# Patient Record
Sex: Male | Born: 1975 | Race: White | Hispanic: No | Marital: Single | Smoking: Current every day smoker
Health system: Southern US, Community
[De-identification: ages and names within clinical notes are randomized; demographics above are authoritative.]

## PROBLEM LIST (undated history)

## (undated) DIAGNOSIS — F1990 Other psychoactive substance use, unspecified, uncomplicated: Secondary | ICD-10-CM

## (undated) HISTORY — PX: EYE SURGERY: SHX253

## (undated) HISTORY — PX: OTHER SURGICAL HISTORY: SHX169

---

## 2014-05-10 ENCOUNTER — Emergency Department: Payer: Self-pay | Admitting: Emergency Medicine

## 2016-01-25 ENCOUNTER — Encounter: Payer: Self-pay | Admitting: Medical Oncology

## 2016-01-25 ENCOUNTER — Emergency Department: Payer: Self-pay

## 2016-01-25 ENCOUNTER — Emergency Department
Admission: EM | Admit: 2016-01-25 | Discharge: 2016-01-25 | Disposition: A | Discharge status: Home / Self Care | Payer: Self-pay | Attending: Emergency Medicine | Admitting: Emergency Medicine

## 2016-01-25 DIAGNOSIS — W11XXXA Fall on and from ladder, initial encounter: Secondary | ICD-10-CM | POA: Insufficient documentation

## 2016-01-25 DIAGNOSIS — S46911A Strain of unspecified muscle, fascia and tendon at shoulder and upper arm level, right arm, initial encounter: Secondary | ICD-10-CM | POA: Insufficient documentation

## 2016-01-25 DIAGNOSIS — Y929 Unspecified place or not applicable: Secondary | ICD-10-CM | POA: Insufficient documentation

## 2016-01-25 DIAGNOSIS — Y999 Unspecified external cause status: Secondary | ICD-10-CM | POA: Insufficient documentation

## 2016-01-25 DIAGNOSIS — W19XXXA Unspecified fall, initial encounter: Secondary | ICD-10-CM

## 2016-01-25 DIAGNOSIS — Y939 Activity, unspecified: Secondary | ICD-10-CM | POA: Insufficient documentation

## 2016-01-25 MED ORDER — DOCUSATE SODIUM 100 MG PO CAPS: ORAL_CAPSULE | ORAL | Status: DC

## 2016-01-25 MED ORDER — HYDROCODONE-ACETAMINOPHEN 5-325 MG PO TABS: 1.0000 | ORAL_TABLET | ORAL | Status: DC | PRN

## 2016-01-25 NOTE — ED Provider Notes (Signed)
[facility] Emergency Department Provider Note  ____________________________________________  Time seen: Approximately 3:53 PM  I have reviewed the triage vital signs and the nursing notes.   HISTORY  Chief Complaint Fall and Shoulder Pain    HPI Robert Cobb is a 40 y.o. male without significant PMH who presents with pain in R shoulder.  Acute onset when he fell off a ladder last night, falling about 10 ft and landing on the grass but catching weight on RUE.  Did not strike head, no LOC, no neck pain.  Persistent moderate pain in RUE, much worse with movement of R shoulder.  No pain in elbow nor wrist.  No difficulty or limitation of ROM of RUE except due to pain in shoulder.  No difficulty breathing.  Denies CP, SOB, abd pain, N/V/D   History reviewed. No pertinent past medical history.  There are no active problems to display for this patient.   Past Surgical History  Procedure Laterality Date  . Orbital socket surgery      Current Outpatient Rx  Name  Route  Sig  Dispense  Refill  . docusate sodium (COLACE) 100 MG capsule      Take 1 tablet once or twice daily as needed for constipation while taking narcotic pain medicine   30 capsule   0   . HYDROcodone-acetaminophen (NORCO/VICODIN) 5-325 MG tablet   Oral   Take 1-2 tablets by mouth every 4 (four) hours as needed for moderate pain.   30 tablet   0     Allergies Review of patient's allergies indicates no known allergies.  No family history on file.  Social History Social History  Substance Use Topics  . Smoking status: None  . Smokeless tobacco: None  . Alcohol Use: None    Review of Systems Constitutional: No fever/chills Eyes: No visual changes. ENT: No sore throat. Cardiovascular: Denies chest pain. Respiratory: Denies shortness of breath. Gastrointestinal: No abdominal pain.  No nausea, no vomiting.  No diarrhea.  No constipation. Genitourinary: Negative for  dysuria. Musculoskeletal: moderate to severe pain in R shoulder.  Negative for back pain.  Denies neck pain. Skin: Negative for rash. Neurological: Negative for headaches, focal weakness or numbness.  10-point ROS otherwise negative.  ____________________________________________   PHYSICAL EXAM:  VITAL SIGNS: ED Triage Vitals  Enc Vitals Group     BP 01/25/16 1432 146/93 mmHg     Pulse Rate 01/25/16 1432 61     Resp 01/25/16 1432 18     Temp 01/25/16 1432 97.8 F (36.6 C)     Temp Source 01/25/16 1432 Oral     SpO2 01/25/16 1432 100 %     Weight 01/25/16 1432 220 lb (99.791 kg)     Height 01/25/16 1432  (1.778 m)     Head Cir --      Peak Flow --      Pain Score 01/25/16 1433 8     Pain Loc --      Pain Edu? --      Excl. in GC? --     Constitutional: Alert and oriented. Well appearing and in no acute distress. Eyes: Conjunctivae are normal. PERRL. EOMI. Head: Atraumatic. Neck: No stridor.  No meningeal signs.  No cervical spine tenderness to palpation. Respiratory: Normal respiratory effort.  No retractions. Lungs CTAB. Gastrointestinal: Soft and nontender. No distention.  Musculoskeletal: No palpable or visible deformities to right upper extremity including the right shoulder and clavicle.  Severely tender to  palpation all throughout the right shoulder but not distal to the proximal humerus.  Normal range of motion of elbow and wrist.  No acute injuries distally in the right upper extremity.  Other extremities are normal in appearance. Neurologic:  Normal speech and language. No gross focal neurologic deficits are appreciated.  Skin:  Skin is warm, dry and intact. No rash noted.  Subacute abrasion to anterior left lower leg Psychiatric: Mood and affect are normal. Speech and behavior are normal.  ____________________________________________   LABS (all labs ordered are listed, but only abnormal results are displayed)  Labs Reviewed - No data to  display ____________________________________________  EKG  None ____________________________________________  RADIOLOGY   Dg Chest 2 View  01/25/2016  CLINICAL DATA:  40 year old male who fell from 10 foot ladder last night. Right Shoulder pain, chest pain. Initial encounter. EXAM: CHEST  2 VIEW COMPARISON:  Right shoulder series from today reported separately. FINDINGS: Normal lung volumes. Normal cardiac size and mediastinal contours. Visualized tracheal air column is within normal limits. The patient right arm could not be raised for the lateral view. Both lungs appear clear. No pneumothorax or pleural effusion. No acute osseous abnormality identified. IMPRESSION: No acute cardiopulmonary abnormality or acute traumatic injury identified. Electronically Signed   By: Odessa FlemingH  Hall M.D.   On: 01/25/2016 15:27   Dg Shoulder Right  01/25/2016  CLINICAL DATA:  40 year old male who fell from 10 foot ladder last night. Right Shoulder pain, chest pain. Initial encounter. EXAM: RIGHT SHOULDER - 2+ VIEW COMPARISON:  None. FINDINGS: No glenohumeral joint dislocation. Bone mineralization is within normal limits. Proximal right humerus intact. Visible clavicle and right scapula appear intact. Visualized right ribs and lung parenchyma within normal limits. IMPRESSION: No acute fracture or dislocation identified about the right shoulder. Electronically Signed   By: Odessa FlemingH  Hall M.D.   On: 01/25/2016 15:26    ____________________________________________   PROCEDURES  Procedure(s) performed: None  Critical Care performed: No ____________________________________________   INITIAL IMPRESSION / ASSESSMENT AND PLAN / ED COURSE  Pertinent labs & imaging results that were available during my care of the patient were reviewed by me and considered in my medical decision making (see chart for details).  No acute fracture or dislocation.  No neurovascular compromise.  I advised the patient to rest the affected  extremity, use over-the-counter pain medication, prescription pain medicines when necessary, use of sling, and orthopedics follow-up in about one week.  I gave my usual and customary return precautions.     ____________________________________________  FINAL CLINICAL IMPRESSION(S) / ED DIAGNOSES  Final diagnoses:  Right shoulder strain, initial encounter  Fall, initial encounter      NEW MEDICATIONS STARTED DURING THIS VISIT:  New Prescriptions   DOCUSATE SODIUM (COLACE) 100 MG CAPSULE    Take 1 tablet once or twice daily as needed for constipation while taking narcotic pain medicine   HYDROCODONE-ACETAMINOPHEN (NORCO/VICODIN) 5-325 MG TABLET    Take 1-2 tablets by mouth every 4 (four) hours as needed for moderate pain.      Note:  This document was prepared using Dragon voice recognition software and may include unintentional dictation errors.   Loleta Roseory Berk Pilot, MD 01/25/16 [phone]

## 2016-01-25 NOTE — Discharge Instructions (Signed)
We believe that your symptoms are caused by musculoskeletal strain.  Please read through the included information about additional care such as ice packs, heating pads, over-the-counter pain medicine.  Use the sling as for comfort.  Remember that early mobility and using the affected part of your body is actually better than keeping it immobile, and follow up with orthopedics in about a week is important; they will help determine if you had a more serious injury that needs additional outpatient evaluation.  Take Norco as prescribed for severe pain. Do not drink alcohol, drive or participate in any other potentially dangerous activities while taking this medication as it may make you sleepy. Do not take this medication with any other sedating medications, either prescription or over-the-counter. If you were prescribed Percocet or Vicodin, do not take these with acetaminophen (Tylenol) as it is already contained within these medications.   This medication is an opiate (or narcotic) pain medication and can be habit forming.  Use it as little as possible to achieve adequate pain control.  Do not use or use it with extreme caution if you have a history of opiate abuse or dependence.  If you are on a pain contract with your primary care doctor or a pain specialist, be sure to let them know you were prescribed this medication today from the [facility].  This medication is intended for your use only - do not give any to anyone else and keep it in a secure place where nobody else, especially children, have access to it.  It will also cause or worsen constipation, so you may want to consider taking an over-the-counter stool softener while you are taking this medication.  Follow-up with the doctor listed as recommended or return to the emergency department with new or worsening symptoms that concern you.

## 2016-01-25 NOTE — ED Notes (Signed)
Pt reports he fell about 4210ft from a ladder last night around 0800. Pt reports pain to rt shoulder. Denies sob. Pt ambulatory in NAD. Denies neck pain/denies LOC.

## 2016-09-17 ENCOUNTER — Emergency Department
Admission: EM | Admit: 2016-09-17 | Discharge: 2016-09-17 | Disposition: A | Discharge status: Home / Self Care | Payer: Self-pay | Attending: Emergency Medicine | Admitting: Emergency Medicine

## 2016-09-17 ENCOUNTER — Encounter: Payer: Self-pay | Admitting: Emergency Medicine

## 2016-09-17 DIAGNOSIS — S46912A Strain of unspecified muscle, fascia and tendon at shoulder and upper arm level, left arm, initial encounter: Secondary | ICD-10-CM | POA: Insufficient documentation

## 2016-09-17 DIAGNOSIS — W208XXA Other cause of strike by thrown, projected or falling object, initial encounter: Secondary | ICD-10-CM | POA: Insufficient documentation

## 2016-09-17 DIAGNOSIS — Y999 Unspecified external cause status: Secondary | ICD-10-CM | POA: Insufficient documentation

## 2016-09-17 DIAGNOSIS — Y9389 Activity, other specified: Secondary | ICD-10-CM | POA: Insufficient documentation

## 2016-09-17 DIAGNOSIS — Y929 Unspecified place or not applicable: Secondary | ICD-10-CM | POA: Insufficient documentation

## 2016-09-17 DIAGNOSIS — Z79899 Other long term (current) drug therapy: Secondary | ICD-10-CM | POA: Insufficient documentation

## 2016-09-17 DIAGNOSIS — T148XXA Other injury of unspecified body region, initial encounter: Secondary | ICD-10-CM

## 2016-09-17 MED ORDER — ORPHENADRINE CITRATE 30 MG/ML IJ SOLN
60.0000 mg | Freq: Two times a day (BID) | INTRAMUSCULAR | Status: DC
  Administered 2016-09-17: 60 mg via INTRAMUSCULAR
  Filled 2016-09-17: qty 2

## 2016-09-17 MED ORDER — CYCLOBENZAPRINE HCL 5 MG PO TABS: 5.0000 mg | ORAL_TABLET | Freq: Every day | ORAL | 0 refills | Status: AC

## 2016-09-17 MED ORDER — MELOXICAM 15 MG PO TABS: 15.0000 mg | ORAL_TABLET | Freq: Every day | ORAL | 0 refills | Status: AC

## 2016-09-17 MED ORDER — KETOROLAC TROMETHAMINE 60 MG/2ML IM SOLN
60.0000 mg | Freq: Once | INTRAMUSCULAR | Status: AC
  Administered 2016-09-17: 60 mg via INTRAMUSCULAR
  Filled 2016-09-17: qty 2

## 2016-09-17 NOTE — ED Notes (Signed)
Pt reports that 5-6 months ago he came to ED because he hurt left shoulder - a few days ago was pushing a furnace into the attic and the furnace started to fall and the pt grabbed it pulling him off the stairs and pulling left arm down - left side of neck down into left shoulder are throbbing and numb - pt reports hearing a "pop" when he grabbed the furnace

## 2016-09-17 NOTE — ED Triage Notes (Signed)
Patient presents to the ED with left sided neck and shoulder pain with numbness after he attempted to catch a falling furnace.  Patient has sensation and mobility to left fingers.  Patient is holding arm very still in triage.  No obvious distress at this time.

## 2016-09-17 NOTE — ED Provider Notes (Signed)
[facility] Emergency Department Provider Note  ____________________________________________  Time seen: Approximately 12:21 PM  I have reviewed the triage vital signs and the nursing notes.   HISTORY  Chief Complaint Shoulder Pain and Neck Pain    HPI Robert Cobb is a 40 y.o. male that presents to the emergency department with left shoulder pain and upper left back pain after trying to catch a falling furnace yesterday. Patient states that he was able to fully move left arm that it is painful over upper left back with movement. Patient denies direct trauma to shoulder or muscle. He denies any additional injuries.   History reviewed. No pertinent past medical history.  There are no active problems to display for this patient.   Past Surgical History:  Procedure Laterality Date  . Orbital socket surgery      Prior to Admission medications   Medication Sig Start Date End Date Taking? Authorizing Provider  cyclobenzaprine (FLEXERIL) 5 MG tablet Take 1 tablet (5 mg total) by mouth at bedtime. 09/17/16 09/27/16  Enid DerryAshley Marcheta Horsey, PA-C  docusate sodium (COLACE) 100 MG capsule Take 1 tablet once or twice daily as needed for constipation while taking narcotic pain medicine 01/25/16   Loleta Roseory Forbach, MD  HYDROcodone-acetaminophen (NORCO/VICODIN) 5-325 MG tablet Take 1-2 tablets by mouth every 4 (four) hours as needed for moderate pain. 01/25/16   Loleta Roseory Forbach, MD  meloxicam (MOBIC) 15 MG tablet Take 1 tablet (15 mg total) by mouth daily. 09/17/16 09/27/16  Enid DerryAshley Edda Orea, PA-C    Allergies Patient has no known allergies.  No family history on file.  Social History Social History  Substance Use Topics  . Smoking status: Never Smoker  . Smokeless tobacco: Never Used  . Alcohol use Yes     Comment: rarely     Review of Systems  Constitutional: No fever/chills Eyes: No visual changes. No discharge ENT: No upper respiratory complaints. Cardiovascular: no  chest pain. Respiratory: no cough. No SOB. Gastrointestinal: No abdominal pain.  No nausea, no vomiting.  No diarrhea.  No constipation. Musculoskeletal: Negative for musculoskeletal pain. Skin: Negative for rash, abrasions, lacerations, ecchymosis. Neurological: Negative for headaches, focal weakness or numbness.   ____________________________________________   PHYSICAL EXAM:  VITAL SIGNS: ED Triage Vitals  Enc Vitals Group     BP 09/17/16 1128 126/76     Pulse Rate 09/17/16 1128 73     Resp 09/17/16 1128 18     Temp 09/17/16 1128 98.5 F (36.9 C)     Temp Source 09/17/16 1128 Oral     SpO2 09/17/16 1128 97 %     Weight 09/17/16 1128 237 lb (107.5 kg)     Height 09/17/16 1128 5\' 11"  (1.803 m)     Head Circumference --      Peak Flow --      Pain Score 09/17/16 1130 10     Pain Loc --      Pain Edu? --      Excl. in GC? --      Constitutional: Alert and oriented. Well appearing and in no acute distress. Eyes: Conjunctivae are normal. PERRL. EOMI. Head: Atraumatic. ENT:      Ears:       Nose: No congestion/rhinnorhea.      Mouth/Throat: Mucous membranes are moist.  Neck: No stridor.  No cervical spine tenderness to palpation.Full range of motion of neck. Hematological/Lymphatic/Immunilogical: No cervical lymphadenopathy. Cardiovascular: Normal rate, regular rhythm. Normal S1 and S2.  Good peripheral circulation. Respiratory: Normal  respiratory effort without tachypnea or retractions. Lungs CTAB. Good air entry to the bases with no decreased or absent breath sounds. Musculoskeletal: Full range of motion to all extremities. No gross deformities appreciated. Pain over trapezius muscle with abduction of left arm. Tenderness to palpation throughout trapezius muscle. No tenderness to palpation over shoulder joint. Neurologic:  Normal speech and language. No gross focal neurologic deficits are appreciated.  Skin:  Skin is warm, dry and intact. No rash noted. Psychiatric: Mood  and affect are normal. Speech and behavior are normal. Patient exhibits appropriate insight and judgement.   ____________________________________________   LABS (all labs ordered are listed, but only abnormal results are displayed)  Labs Reviewed - No data to display ____________________________________________  EKG   ____________________________________________  RADIOLOGY   No results found.  ____________________________________________    PROCEDURES  Procedure(s) performed:    Procedures    Medications  orphenadrine (NORFLEX) injection 60 mg (60 mg Intramuscular Given 09/17/16 1236)  ketorolac (TORADOL) injection 60 mg (60 mg Intramuscular Given 09/17/16 1236)     ____________________________________________   INITIAL IMPRESSION / ASSESSMENT AND PLAN / ED COURSE  Pertinent labs & imaging results that were available during my care of the patient were reviewed by me and considered in my medical decision making (see chart for details).  Review of the [place] CSRS was performed in accordance of the [facility] prior to dispensing any controlled drugs.  Clinical Course     Patient's diagnosis is consistent with Muscle strain. Patient has tenderness to palpation throughout trapezius muscle. Patient has full range of motion of shoulder and neck. No indication for x-ray as there was no direct injury to shoulder and no pain over shoulder. Patient states that this pain feels similar to previous muscle pain, which resolved after a couple of weeks. Patient will be discharged home with prescriptions for Meloxicam and Flexeril. Patient is to eat take Flexeril at night and not while working. Patient is to follow up with PCP as needed or otherwise directed. Patient is given ED precautions to return to the ED for any worsening or new symptoms.     ____________________________________________  FINAL CLINICAL IMPRESSION(S) / ED DIAGNOSES  Final diagnoses:  Muscle strain       NEW MEDICATIONS STARTED DURING THIS VISIT:  Discharge Medication List as of 09/17/2016 12:56 PM    START taking these medications   Details  cyclobenzaprine (FLEXERIL) 5 MG tablet Take 1 tablet (5 mg total) by mouth at bedtime., Starting Mon 09/17/2016, Until Thu 09/27/2016, Print    meloxicam (MOBIC) 15 MG tablet Take 1 tablet (15 mg total) by mouth daily., Starting Mon 09/17/2016, Until Thu 09/27/2016, Print            This chart was dictated using voice recognition software/Dragon. Despite best efforts to proofread, errors can occur which can change the meaning. Any change was purely unintentional.   Enid DerryAshley Vernor Monnig, PA-C 09/17/16 1412    Sharman CheekPhillip Stafford, MD 09/18/16 [phone]

## 2016-11-10 ENCOUNTER — Emergency Department
Admission: EM | Admit: 2016-11-10 | Discharge: 2016-11-10 | Disposition: A | Discharge status: Home / Self Care | Payer: 59 | Attending: Emergency Medicine | Admitting: Emergency Medicine

## 2016-11-10 DIAGNOSIS — F1110 Opioid abuse, uncomplicated: Secondary | ICD-10-CM | POA: Insufficient documentation

## 2016-11-10 LAB — CBC
HCT: 44.7 % (ref 40.0–52.0)
HEMOGLOBIN: 14.9 g/dL (ref 13.0–18.0)
MCH: 27.3 pg (ref 26.0–34.0)
MCHC: 33.3 g/dL (ref 32.0–36.0)
MCV: 82 fL (ref 80.0–100.0)
Platelets: 249 10*3/uL (ref 150–440)
RBC: 5.45 MIL/uL (ref 4.40–5.90)
RDW: 14.5 % (ref 11.5–14.5)
WBC: 8.7 10*3/uL (ref 3.8–10.6)

## 2016-11-10 LAB — COMPREHENSIVE METABOLIC PANEL
ALBUMIN: 4.4 g/dL (ref 3.5–5.0)
ALK PHOS: 58 U/L (ref 38–126)
ALT: 68 U/L — AB (ref 17–63)
AST: 48 U/L — AB (ref 15–41)
Anion gap: 8 (ref 5–15)
BILIRUBIN TOTAL: 0.8 mg/dL (ref 0.3–1.2)
BUN: 13 mg/dL (ref 6–20)
CALCIUM: 9.1 mg/dL (ref 8.9–10.3)
CO2: 25 mmol/L (ref 22–32)
CREATININE: 1.03 mg/dL (ref 0.61–1.24)
Chloride: 106 mmol/L (ref 101–111)
GFR calc Af Amer: >60 mL/min (ref >60)
GLUCOSE: 114 mg/dL — AB (ref 65–99)
Potassium: 3.8 mmol/L (ref 3.5–5.1)
Sodium: 139 mmol/L (ref 135–145)
TOTAL PROTEIN: 8.5 g/dL — AB (ref 6.5–8.1)

## 2016-11-10 LAB — ETHANOL

## 2016-11-10 MED ORDER — ONDANSETRON 4 MG PO TBDP
4.0000 mg | ORAL_TABLET | Freq: Once | ORAL | Status: AC
  Administered 2016-11-10: 4 mg via ORAL
  Filled 2016-11-10: qty 1

## 2016-11-10 NOTE — Discharge Instructions (Signed)
Please return immediately if condition worsens. Please contact her primary physician or the physician you were given for referral. If you have any specialist physicians involved in her treatment and plan please also contact them. Thank you for using [facility]. ° °

## 2016-11-10 NOTE — ED Notes (Signed)
Pt states he is needing a referral to get help with detoxing off of opiates. Pt typically uses hydrocodone and last used 10mg  today as well as 50mg  of Tramadol. Pt states he takes Suboxone daily as well which he took before the opiates and reports intermittent episodes of vomiting and diarrhea.  Pt states he last used IV heroin about 2 weeks ago. Denies any SI/HI.  Pt states he needs help but cannot do anything until he talks to his boss on Monday and wants to start treatment on Tuesday after talking to his boss so he does not lose his job.

## 2016-11-10 NOTE — ED Provider Notes (Signed)
Time Seen: Approximately 1230  I have reviewed the triage notes  Chief Complaint: Addiction Problem   History of Present Illness: Robert Cobb is a 41 y.o. male *who arrives requesting detox. The patient states he took some Ultram and Vicodin earlier today. He has a history of heroin abuse in the last time he used IV heroin was one week ago. He states he's been using illicit drugs now for the past 2 years. He is here voluntarily and wishes to get outpatient drug treatment established. He's had some nausea and vomiting he felt mostly due to him taking Suboxone and then taking the narcotic medication. The patient denies any chest pain or abdominal pain or diarrhea. He denies any suicidal thoughts, homicidal thoughts, hallucinations. History reviewed. No pertinent past medical history.  There are no active problems to display for this patient.   Past Surgical History:  Procedure Laterality Date  . Orbital socket surgery      Past Surgical History:  Procedure Laterality Date  . Orbital socket surgery      Current Outpatient Rx  . Order #: [id number] Class: Print  . Order #: [id number] Class: Print    Allergies:  Patient has no known allergies.  Family History: No family history on file.  Social History: Social History  Substance Use Topics  . Smoking status: Never Smoker  . Smokeless tobacco: Never Used  . Alcohol use Yes     Comment: rarely     Review of Systems:   10 point review of systems was performed and was otherwise negative:  Constitutional: No fever Eyes: No visual disturbances ENT: No sore throat, ear pain Cardiac: No chest pain Respiratory: No shortness of breath, wheezing, or stridor Abdomen: No abdominal pain, no vomiting, No diarrhea Endocrine: No weight loss, No night sweats Extremities: No peripheral edema, cyanosis Skin: No rashes, easy bruising Neurologic: No focal weakness, trouble with speech or swollowing Urologic: No dysuria, Hematuria,  or urinary frequency   Physical Exam:  ED Triage Vitals [11/10/16 1212]  Enc Vitals Group     BP      Pulse      Resp      Temp      Temp src      SpO2      Weight 237 lb (107.5 kg)     Height 5\' 10"  (1.778 m)     Head Circumference      Peak Flow      Pain Score      Pain Loc      Pain Edu?      Excl. in GC?     General: Awake , Alert , and Oriented times 3; GCS 15 Head: Normal cephalic , atraumatic Eyes: Pupils equal , round, reactive to light Nose/Throat: No nasal drainage, patent upper airway without erythema or exudate.  Neck: Supple, Full range of motion, No anterior adenopathy or palpable thyroid masses Lungs: Clear to ascultation without wheezes , rhonchi, or rales Heart: Regular rate, regular rhythm without murmurs , gallops , or rubs Abdomen: Soft, non tender without rebound, guarding , or rigidity; bowel sounds positive and symmetric in all 4 quadrants. No organomegaly .        Extremities: 2 plus symmetric pulses. No edema, clubbing or cyanosis Neurologic: normal ambulation, Motor symmetric without deficits, sensory intact Skin: warm, dry, no rashes   Labs:   All laboratory work was reviewed including any pertinent negatives or positives listed below:  Labs Reviewed  COMPREHENSIVE METABOLIC PANEL -  Abnormal; Notable for the following:       Result Value   Glucose, Bld 114 (*)    Total Protein 8.5 (*)    AST 48 (*)    ALT 68 (*)    All other components within normal limits  CBC  ETHANOL  URINE DRUG SCREEN, QUALITATIVE ([facility] ONLY)     ED Course:  Patient will receive a TTS consult to establish outpatient drug treatment program. He is currently hemodynamically stable. He was given Zofran for nausea and vomiting. The patient's otherwise his not appear to have any physical limitations to completing his outpatient therapy.Psychiatric consult necessary*     Assessment:  Narcotic abuse Seeking drug treatment program      Plan:   Outpatient Patient was advised to return immediately if condition worsens. Patient was advised to follow up with their primary care physician or other specialized physicians involved in their outpatient care. The patient and/or family member/power of attorney had laboratory results reviewed at the bedside. All questions and concerns were addressed and appropriate discharge instructions were distributed by the nursing staff.             Jennye Moccasin, MD 11/10/16 1314

## 2016-11-10 NOTE — BH Assessment (Signed)
Writer provided patient with substance abuse resources.  Patient denies SI/HI/Psychosis.  Writer consulted with Dr. Huel CoteQuigley and he reports that the patient does not need an assessment only resources for substance abuse.

## 2016-11-10 NOTE — ED Triage Notes (Addendum)
Pt requesting help with substance abuse. Pt reports using hydrocodone and percocet and last used heroine 1 week ago. Has been using for about 2 years. Here voluntarily wanting to speak to someone. Denies SI/HI.

## 2016-11-15 ENCOUNTER — Ambulatory Visit: Payer: 59 | Admitting: Family Medicine

## 2017-01-07 ENCOUNTER — Encounter: Payer: Self-pay | Admitting: Emergency Medicine

## 2017-01-07 ENCOUNTER — Emergency Department
Admission: EM | Admit: 2017-01-07 | Discharge: 2017-01-07 | Disposition: A | Discharge status: Home / Self Care | Payer: Self-pay | Attending: Emergency Medicine | Admitting: Emergency Medicine

## 2017-01-07 DIAGNOSIS — K0381 Cracked tooth: Secondary | ICD-10-CM | POA: Insufficient documentation

## 2017-01-07 DIAGNOSIS — Z79899 Other long term (current) drug therapy: Secondary | ICD-10-CM | POA: Insufficient documentation

## 2017-01-07 DIAGNOSIS — K047 Periapical abscess without sinus: Secondary | ICD-10-CM | POA: Insufficient documentation

## 2017-01-07 MED ORDER — AMOXICILLIN 500 MG PO TABS: 500.0000 mg | ORAL_TABLET | Freq: Three times a day (TID) | ORAL | 0 refills | Status: AC

## 2017-01-07 NOTE — ED Notes (Signed)
See triage note, pt reports left side facial pain and swelling due to toothache x 3 days.

## 2017-01-07 NOTE — ED Triage Notes (Signed)
Pt to ed with c/o left sided facial pain and swelling and toothache x 3 days.

## 2017-01-07 NOTE — Discharge Instructions (Signed)
OPTIONS FOR DENTAL FOLLOW UP CARE ° °[place] Department of Health and Human Services - Local Safety Net Dental Clinics °http://www.ncdhhs.gov/dph/oralhealth/services/safetynetclinics.htm °  °[facility] (336-562-3123) ° °[facility] (919-933-9087) ° °[facility] (919-663-1744 ext [phone]) ° °[facility] ([phone]) ° °[facility] ([phone]) °This clinic caters to the indigent population and is on a lottery system. °Location: °[facility], [address], [place] °Clinic Hours: °Wednesdays from 6pm - 9pm, patients seen by a lottery system. °For dates, call or go to [email or web address].[facility].edu/[facility]/patients/Dental-[facility] °Services: °Cleanings, fillings and simple extractions. °Payment Options: °DENTAL WORK IS FREE OF CHARGE. Bring proof of income or support. °Best way to get seen: °Arrive at 5:15 pm - this is a lottery, NOT first come/first serve, so arriving earlier will not increase your chances of being seen. °  °  °[facility] °[phone] °Select option 1 for emergencies °  °Location: °[facility], [address], [place] °Clinic Hours: °No walk-ins accepted - call the day before to schedule an appointment. °Check in times are 9:30 am and 1:30 pm. °Services: °Simple extractions, temporary fillings, pulpectomy/pulp debridement, uncomplicated abscess drainage. °Payment Options: °PAYMENT IS DUE AT THE TIME OF SERVICE.  Fee is usually $100-200, additional surgical procedures (e.g. abscess drainage) may be extra. °Cash, checks, Visa/MasterCard accepted.  Can file Medicaid if patient is covered for dental - patient should call case worker to check. °No discount for [facility] patients. °Best way to get seen: °MUST call the day before and get onto the schedule. Can usually be seen the next 1-2 days. No walk-ins accepted. °  °  °[facility] °[phone] °   °Location: °[facility], [address], [place] °Clinic Hours: °M, W, Th, F 8am or 1:30pm, Tues 9a or 1:30 - first come/first served. °Services: °Simple extractions, temporary fillings, uncomplicated abscess drainage.  You do not need to be an [place] resident. °Payment Options: °PAYMENT IS DUE AT THE TIME OF SERVICE. °Dental insurance, otherwise sliding scale - bring proof of income or support. °Depending on income and treatment needed, cost is usually $50-200. °Best way to get seen: °Arrive early as it is first come/first served. °  °  °[facility] °[phone] °  °Location: °[address] °Clinic Hours: °Mon-Thu 8a-5p °Services: °Most basic dental services including extractions and fillings. °Payment Options: °PAYMENT IS DUE AT THE TIME OF SERVICE. °Sliding scale, up to 50% off - bring proof if income or support. °Medicaid with dental option accepted. °Best way to get seen: °Call to schedule an appointment, can usually be seen within 2 weeks OR they will try to see walk-ins - show up at 8a or 2p (you may have to wait). °  °  °[facility] °[phone] °[place] RESIDENTS ONLY °  °Location: °[facility], [address], [place], [place] [address] °Clinic Hours: By appointment only. °Monday - Thursday 8am-5pm, Friday 8am-12pm °Services: Cleanings, fillings, extractions. °Payment Options: °PAYMENT IS DUE AT THE TIME OF SERVICE. °Cash, Visa or MasterCard. Sliding scale - $30 minimum per service. °Best way to get seen: °Come in to office, complete packet and make an appointment - need proof of income °or support monies for each household member and proof of [place] residence. °Usually takes about a month to get in. °  °  °[facility] °[phone] °  °Location: °[address]   [place] °Clinic Hours: Walk-in Urgent Care Dental Services are offered Monday-Friday  mornings only. °The numbers of emergencies accepted daily is limited to the number of °providers available. °Maximum 15 - Mondays, Wednesdays & Thursdays °Maximum 10 - Tuesdays & Fridays °Services: °You do not need to be [place] resident to be seen for a dental emergency. °Emergencies are defined as pain, swelling, abnormal bleeding, or dental trauma. Walkins will receive x-rays if needed. °NOTE: Dental cleaning is not an emergency. °Payment Options: °PAYMENT IS DUE AT THE TIME OF SERVICE. °Minimum co-pay is $40.00 for uninsured patients. °Minimum co-pay is $3.00 for Medicaid with dental coverage. °Dental Insurance is accepted and must be presented at time of visit. °Medicare does not cover dental. °Forms of payment: Cash, credit card, checks. °Best way to get seen: °If not previously registered with the clinic, walk-in dental registration begins at 7:15 am and is on a first come/first serve basis. °If previously registered with the clinic, call to make an appointment. °  °  °[facility] °[phone] °[place] RESIDENTS ONLY °  °Location: °[address], [place], [place] °Clinic Hours: °Mon-Thu 10a-2p °Services: Extractions only! °Payment Options: °FREE (donations accepted) - bring proof of income or support °Best way to get seen: °Call and schedule an appointment OR come at 8am on the 1st Monday of every month (except for holidays) when it is first come/first served. °  °  °[facility] °[phone] °  °Location: °[address], [place] °Clinic Hours: °Friday mornings °Services, Payment Options, Best way to get seen: °Call for info °

## 2017-01-07 NOTE — ED Provider Notes (Signed)
[facility] Emergency Department Provider Note  ____________________________________________  Time seen: Approximately 5:44 PM  I have reviewed the triage vital signs and the nursing notes.   HISTORY  Chief Complaint Dental Pain    HPI Robert Cobb is a 41 y.o. male presenting to the emergency department with a left lower jaw dental abscess from a broken Inferior 22. 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 Patient states that he needs a prescription for antibiotic before he can secure care. He denies fever, chills, nausea and vomiting. No alleviating measures a been attempted.   History reviewed. No pertinent past medical history.  There are no active problems to display for this patient.   Past Surgical History:  Procedure Laterality Date  . Orbital socket surgery      Prior to Admission medications   Medication Sig Start Date End Date Taking? Authorizing Provider  amoxicillin (AMOXIL) 500 MG tablet Take 1 tablet (500 mg total) by mouth 3 (three) times daily. 01/07/17 01/17/17  Orvil Feil, PA-C  docusate sodium (COLACE) 100 MG capsule Take 1 tablet once or twice daily as needed for constipation while taking narcotic pain medicine 01/25/16   Loleta Rose, MD  HYDROcodone-acetaminophen (NORCO/VICODIN) 5-325 MG tablet Take 1-2 tablets by mouth every 4 (four) hours as needed for moderate pain. 01/25/16   Loleta Rose, MD    Allergies Patient has no known allergies.  History reviewed. No pertinent family history.  Social History Social History  Substance Use Topics  . Smoking status: Never Smoker  . Smokeless tobacco: Never Used  . Alcohol use No     Comment: rarely    Review of Systems  Constitutional: No fever/chills Eyes: No visual changes. No discharge ENT: Patient has right lower jaw dental abscess.  Cardiovascular: no chest pain. Respiratory: no cough. No SOB. Gastrointestinal: No  abdominal pain.  No nausea, no vomiting.  No diarrhea.  No constipation. Musculoskeletal: Negative for musculoskeletal pain. Skin: Negative for rash, abrasions, lacerations, ecchymosis. Neurological: Negative for headaches, focal weakness or numbness. ____________________________________________   PHYSICAL EXAM:  VITAL SIGNS: ED Triage Vitals  Enc Vitals Group     BP 01/07/17 1545 (!) 153/88     Pulse Rate 01/07/17 1545 (!) 102     Resp 01/07/17 1545 20     Temp 01/07/17 1545 98.3 F (36.8 C)     Temp Source 01/07/17 1545 Oral     SpO2 01/07/17 1545 98 %     Weight 01/07/17 1546 235 lb (106.6 kg)     Height 01/07/17 1546  (1.778 m)     Head Circumference --      Peak Flow --      Pain Score 01/07/17 1548 9     Pain Loc --      Pain Edu? --      Excl. in GC? --     Constitutional: Alert and oriented. Well appearing and in no acute distress. Eyes: Conjunctivae are normal. PERRL. EOMI. Head: Atraumatic. ENT:      Nose: No congestion/rhinnorhea.      Mouth/Throat: Mucous membranes are moist. Patient has right lower jaw dental abscess from a broken inferior 22. Cardiovascular: Normal rate, regular rhythm. Normal S1 and S2.  Good peripheral circulation. Respiratory: Normal respiratory effort without tachypnea or retractions. Lungs CTAB. Good air entry to the bases with no decreased or absent breath sounds. Psychiatric: Mood and affect are normal. Speech and behavior are  normal. Patient exhibits appropriate insight and judgement.   ____________________________________________   LABS (all labs ordered are listed, but only abnormal results are displayed)  Labs Reviewed - No data to display ____________________________________________  EKG   ____________________________________________  RADIOLOGY   No results found.  ____________________________________________    PROCEDURES  Procedure(s) performed:    Procedures    Medications - No data to  display   ____________________________________________   INITIAL IMPRESSION / ASSESSMENT AND PLAN / ED COURSE  Pertinent labs & imaging results that were available during my care of the patient were reviewed by me and considered in my medical decision making (see chart for details).  Review of the [place] CSRS was performed in accordance of the [facility] prior to dispensing any controlled drugs.    Assessment and plan: Dental Abscess  Patient presents to the emergency department with a right lower jaw dental abscess from a broken inferior 22. Patient was discharged with amoxicillin. Vital signs are reassuring at this time. All patient questions were answered.   ____________________________________________  FINAL CLINICAL IMPRESSION(S) / ED DIAGNOSES  Final diagnoses:  Dental abscess      NEW MEDICATIONS STARTED DURING THIS VISIT:  New Prescriptions   AMOXICILLIN (AMOXIL) 500 MG TABLET    Take 1 tablet (500 mg total) by mouth 3 (three) times daily.        This chart was dictated using voice recognition software/Dragon. Despite best efforts to proofread, errors can occur which can change the meaning. Any change was purely unintentional.    Orvil Feil, PA-C 01/07/17 1754    Minna Antis, MD 01/07/17 2241

## 2017-02-15 ENCOUNTER — Emergency Department: Payer: Self-pay

## 2017-02-15 ENCOUNTER — Emergency Department
Admission: EM | Admit: 2017-02-15 | Discharge: 2017-02-15 | Disposition: A | Discharge status: Home / Self Care | Payer: Self-pay | Attending: Emergency Medicine | Admitting: Emergency Medicine

## 2017-02-15 ENCOUNTER — Inpatient Hospital Stay
Admission: EM | Admit: 2017-02-15 | Discharge: 2017-02-16 | DRG: 603 | Disposition: A | Discharge status: Home / Self Care | Payer: Self-pay | Attending: Internal Medicine | Admitting: Internal Medicine

## 2017-02-15 ENCOUNTER — Encounter: Payer: Self-pay | Admitting: Emergency Medicine

## 2017-02-15 DIAGNOSIS — L0390 Cellulitis, unspecified: Secondary | ICD-10-CM

## 2017-02-15 DIAGNOSIS — Z5321 Procedure and treatment not carried out due to patient leaving prior to being seen by health care provider: Secondary | ICD-10-CM | POA: Insufficient documentation

## 2017-02-15 DIAGNOSIS — F1910 Other psychoactive substance abuse, uncomplicated: Secondary | ICD-10-CM | POA: Diagnosis present

## 2017-02-15 DIAGNOSIS — M79641 Pain in right hand: Secondary | ICD-10-CM | POA: Insufficient documentation

## 2017-02-15 DIAGNOSIS — F1990 Other psychoactive substance use, unspecified, uncomplicated: Secondary | ICD-10-CM

## 2017-02-15 DIAGNOSIS — L03113 Cellulitis of right upper limb: Principal | ICD-10-CM | POA: Diagnosis present

## 2017-02-15 LAB — LACTIC ACID, PLASMA: LACTIC ACID, VENOUS: 1 mmol/L (ref 0.5–1.9)

## 2017-02-15 LAB — CBC WITH DIFFERENTIAL/PLATELET
BASOS ABS: 0.1 10*3/uL (ref 0–0.1)
Basophils Relative: 1 %
EOS ABS: 0.2 10*3/uL (ref 0–0.7)
Eosinophils Relative: 2 %
HCT: 45.1 % (ref 40.0–52.0)
HEMOGLOBIN: 15.4 g/dL (ref 13.0–18.0)
LYMPHS ABS: 2.5 10*3/uL (ref 1.0–3.6)
LYMPHS PCT: 19 %
MCH: 27.3 pg (ref 26.0–34.0)
MCHC: 34.1 g/dL (ref 32.0–36.0)
MCV: 80.3 fL (ref 80.0–100.0)
Monocytes Absolute: 1.1 10*3/uL — ABNORMAL HIGH (ref 0.2–1.0)
Monocytes Relative: 8 %
Neutro Abs: 9.6 10*3/uL — ABNORMAL HIGH (ref 1.4–6.5)
Neutrophils Relative %: 70 %
Platelets: 286 10*3/uL (ref 150–440)
RBC: 5.62 MIL/uL (ref 4.40–5.90)
RDW: 14.5 % (ref 11.5–14.5)
WBC: 13.4 10*3/uL — ABNORMAL HIGH (ref 3.8–10.6)

## 2017-02-15 LAB — COMPREHENSIVE METABOLIC PANEL
ALBUMIN: 4.4 g/dL (ref 3.5–5.0)
ALT: 73 U/L — AB (ref 17–63)
AST: 46 U/L — AB (ref 15–41)
Alkaline Phosphatase: 63 U/L (ref 38–126)
Anion gap: 8 (ref 5–15)
BUN: 16 mg/dL (ref 6–20)
CHLORIDE: 102 mmol/L (ref 101–111)
CO2: 26 mmol/L (ref 22–32)
CREATININE: 1.2 mg/dL (ref 0.61–1.24)
Calcium: 9.1 mg/dL (ref 8.9–10.3)
GFR calc Af Amer: >60 mL/min (ref >60)
GFR calc non Af Amer: >60 mL/min (ref >60)
GLUCOSE: 113 mg/dL — AB (ref 65–99)
POTASSIUM: 4.1 mmol/L (ref 3.5–5.1)
Sodium: 136 mmol/L (ref 135–145)
Total Bilirubin: 0.6 mg/dL (ref 0.3–1.2)
Total Protein: 8.4 g/dL — ABNORMAL HIGH (ref 6.5–8.1)

## 2017-02-15 MED ORDER — VANCOMYCIN HCL IN DEXTROSE 1-5 GM/200ML-% IV SOLN
1000.0000 mg | Freq: Once | INTRAVENOUS | Status: AC
  Administered 2017-02-16: 1000 mg via INTRAVENOUS
  Filled 2017-02-15: qty 200

## 2017-02-15 MED ORDER — PIPERACILLIN-TAZOBACTAM 3.375 G IVPB 30 MIN
3.3750 g | Freq: Once | INTRAVENOUS | Status: AC
  Administered 2017-02-16: 3.375 g via INTRAVENOUS
  Filled 2017-02-15: qty 50

## 2017-02-15 NOTE — ED Triage Notes (Signed)
Pt c/o pain and swelling to right hand. Reports cut his hand working on trees 2-3 days ago and then started having pain and swelling.  Denies fevers. Right hand has noted swelling with some redness; warm to touch.

## 2017-02-15 NOTE — ED Notes (Signed)
Unable to gain IV access at this time after 2 tries, RN will try with ultrasound

## 2017-02-15 NOTE — ED Notes (Signed)
Pt reports has surprise bday part at 630 and cannot wait now. Informed pt if gets worse come back and he needs this hand looked at because may need abx IV and MD needs to evaluate. Verbalized understanding.

## 2017-02-15 NOTE — ED Provider Notes (Signed)
[facility] Emergency Department Provider Note       Time seen: ----------------------------------------- 10:51 PM on 02/15/2017 -----------------------------------------     I have reviewed the triage vital signs and the nursing notes.   HISTORY   Chief Complaint Hand Injury and Chills    HPI Robert Cobb is a 41 y.o. male who presents to the ED for right hand pain. Patient does admit to IV drug abuse less than a week ago. 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 Presents with significant right hand warmth and swelling and tenderness. He has described chills but no fever. Pain is 7 out of 10 in his right hand, nothing makes it better, movement makes it worse. He has difficulty making a fist.   History reviewed. No pertinent past medical history.  There are no active problems to display for this patient.   Past Surgical History:  Procedure Laterality Date  . Orbital socket surgery      Allergies Patient has no known allergies.  Social History Social History  Substance Use Topics  . Smoking status: Never Smoker  . Smokeless tobacco: Never Used  . Alcohol use No     Comment: rarely    Review of Systems Constitutional: Negative for fever. Eyes: Negative for vision changes ENT:  Negative for congestion, sore throat Cardiovascular: Negative for chest pain. Respiratory: Negative for shortness of breath. Gastrointestinal: Negative for abdominal pain, vomiting and diarrhea. Genitourinary: Negative for dysuria. Musculoskeletal: Positive for right hand pain Skin: Positive for track marks in the right hand, right hand erythema Neurological: Negative for headaches, focal weakness or numbness.  All systems negative/normal/unremarkable except as stated in the HPI  ____________________________________________   PHYSICAL EXAM:  VITAL SIGNS: ED Triage Vitals [02/15/17 2103]   Enc Vitals Group     BP (!) 146/90     Pulse Rate 98     Resp 18     Temp 99.2 F (37.3 C)     Temp Source Oral     SpO2 98 %     Weight 227 lb (103 kg)     Height 5\' 10"  (1.778 m)     Head Circumference      Peak Flow      Pain Score 6     Pain Loc      Pain Edu?      Excl. in GC?     Constitutional: Alert and oriented. Well appearing and in no distress. Eyes: Conjunctivae are normal. PERRL. Normal extraocular movements. ENT   Head: Normocephalic and atraumatic.   Nose: No congestion/rhinnorhea.   Mouth/Throat: Mucous membranes are moist.   Neck: No stridor. Cardiovascular: Normal rate, regular rhythm. No murmurs, rubs, or gallops. Respiratory: Normal respiratory effort without tachypnea nor retractions. Breath sounds are clear and equal bilaterally. No wheezes/rales/rhonchi. Gastrointestinal: Soft and nontender. Normal bowel sounds Musculoskeletal: Marked right hand swelling. Patient has difficulty with range of motion of the fingers of the right hand. There are track marks appreciated over the second and third metacarpals distally. Patient with diffuse swelling of the dorsum of the hand with some extension into the thenar space. Neurologic:  Normal speech and language. No gross focal neurologic deficits are appreciated.  Skin:  Right hand erythema, track marks noted in the right hand and chronic track marks bilaterally. Psychiatric: Mood and affect are normal. Speech and behavior are normal.  ___________________________________________  ED COURSE:  Pertinent labs & imaging results that were available during my care of the patient were reviewed by me and considered in my medical decision making (see chart for details). Patient presents for right hand cellulitis from IV drug abuse, we will assess with labs and imaging as indicated.   Procedures ____________________________________________   LABS (pertinent positives/negatives)  Labs Reviewed  COMPREHENSIVE  METABOLIC PANEL - Abnormal; Notable for the following:       Result Value   Glucose, Bld 113 (*)    Total Protein 8.4 (*)    AST 46 (*)    ALT 73 (*)    All other components within normal limits  CBC WITH DIFFERENTIAL/PLATELET - Abnormal; Notable for the following:    WBC 13.4 (*)    Neutro Abs 9.6 (*)    Monocytes Absolute 1.1 (*)    All other components within normal limits  LACTIC ACID, PLASMA  LACTIC ACID, PLASMA    RADIOLOGY Images were viewed by me  Right hand x-rays  ____________________________________________  FINAL ASSESSMENT AND PLAN  Cellulitis, IV drug abuse  Plan: Patient's labs and imaging were dictated above. Patient had presented for right hand pain and swelling. He has marked right hand swelling and evidence of infection from IV drug abuse. We have ordered IV vancomycin Zosyn. He will need admission and observation to ensure improvement.   Emily FilbertWilliams, Rina Adney E, MD   Note: This note was generated in part or whole with voice recognition software. Voice recognition is usually quite accurate but there are transcription errors that can and very often do occur. I apologize for any typographical errors that were not detected and corrected.     Emily FilbertWilliams, Blythe Hartshorn E, MD 02/15/17 [phone]

## 2017-02-15 NOTE — ED Triage Notes (Signed)
Pt ambulatory to triage in NAD, report working in yard  1 week ago, scratches to hand, this AM significant swelling, warmth, and redness.  Pt's hand appears red and edematous, warm to touch.  PT reports chills as well.

## 2017-02-15 NOTE — H&P (Signed)
History and Physical   [facility] [place] [facility] Ad[facility] History and Physical [employer] Marvell Stavola, D.O.    Patient Name: Robert NamMatthew Highland MR#: [id number] Date of Birth: Feb 16, 1976 Date of Admission: 02/15/2017  Referring MD/NP/PA: Dr. Mayford KnifeWilliams Primary Care Physician: Patient, No Pcp Per Patient coming from: Home   Chief Complaint:  Chief Complaint  Patient presents with  . Hand Injury  . Chills    HPI: Robert NamMatthew Butzer is a 41 y.o. male with a known history of IVDA presents to the emergency department for evaluation of hand swelling.  Patient was in a usual state of health until one week ago when he relapsed and injected and unknown drug into his hand. Today he reports sudden onset of hand pain, swelling and redness and is having difficulty using his hand.  He also reports multiple right hand abrasions from working in the garden.   Patient denies fevers/chills, weakness, dizziness, chest pain, shortness of breath, N/V/C/D, abdominal pain, dysuria/frequency, changes in mental status.    Otherwise there has been no change in status. Patient has been taking medication as prescribed and there has been no recent change in medication or diet.  No recent antibiotics.  There has been no recent illness, hospitalizations, travel or sick contacts.    EMS/ED Course: Patient received Zosyn, Vanco.  Review of Systems:  CONSTITUTIONAL: No fever/chills, fatigue, weakness, weight gain/loss, headache. EYES: No blurry or double vision. ENT: No tinnitus, postnasal drip, redness or soreness of the oropharynx. RESPIRATORY: No cough, dyspnea, wheeze.  No hemoptysis.  CARDIOVASCULAR: No chest pain, palpitations, syncope, orthopnea. No lower extremity edema.  GASTROINTESTINAL: No nausea, vomiting, abdominal pain, diarrhea, constipation.  No hematemesis, melena or hematochezia. GENITOURINARY: No dysuria, frequency, hematuria. ENDOCRINE: No polyuria or nocturia. No heat or cold  intolerance. HEMATOLOGY: No anemia, bruising, bleeding. INTEGUMENTARY: No rashes, ulcers, lesions. MUSCULOSKELETAL: No arthritis, gout, dyspnea. Right hand pain, swelling and redness. NEUROLOGIC: No numbness, tingling, ataxia, seizure-type activity, weakness. PSYCHIATRIC: No anxiety, depression, insomnia.   History reviewed. No pertinent past medical history.  Past Surgical History:  Procedure Laterality Date  . 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  Most recently was in rehab and discharged 01/07/17.  Since then he has used one time, one week ago.  Has previously been on Suboxone and will take one occasionally.  He drinks alcohol rarely and does not use any other drugs.    No Known Allergies  Family history: denies.   Prior to Admission medications   Medication Sig Start Date End Date Taking? Authorizing Provider  docusate sodium (COLACE) 100 MG capsule Take 1 tablet once or twice daily as needed for constipation while taking narcotic pain medicine Patient not taking: Reported on 02/15/2017 01/25/16   Loleta RoseForbach, Cory, MD  HYDROcodone-acetaminophen (NORCO/VICODIN) 5-325 MG tablet Take 1-2 tablets by mouth every 4 (four) hours as needed for moderate pain. Patient not taking: Reported on 02/15/2017 01/25/16   Loleta RoseForbach, Cory, MD    Physical Exam: Vitals:   02/15/17 2103  BP: (!) 146/90  Pulse: 98  Resp: 18  Temp: 99.2 F (37.3 C)  TempSrc: Oral  SpO2: 98%  Weight: 103 kg (227 lb)  Height: 5\' 10"  (1.778 m)    GENERAL: 41 y.o.-year-old male patient, well-developed, well-nourished lying in the bed in no acute distress.  Pleasant and cooperative.   HEENT: Head atraumatic, normocephalic. Pupils equal, round, reactive to light and accommodation. No scleral icterus. Extraocular muscles intact.  Nares are patent. Oropharynx is clear. Mucus membranes moist. NECK: Supple, full range of motion. No JVD, no bruit heard. No thyroid enlargement, no  tenderness, no cervical lymphadenopathy. CHEST: Normal breath sounds bilaterally. No wheezing, rales, rhonchi or crackles. No use of accessory muscles of respiration.  No reproducible chest wall tenderness.  CARDIOVASCULAR: S1, S2 normal. No murmurs, rubs, or gallops. Cap refill <2 seconds. Pulses intact distally.  ABDOMEN: Soft, nondistended, nontender. No rebound, guarding, rigidity. Normoactive bowel sounds present in all four quadrants. No organomegaly or mass. EXTREMITIES: Puffy edema of the right hand to wrist with redness, tenderness, limited range of motion of the fingers.  There are track marks and scratches on the dorsal aspect. Grip strength on the right hand limited by pain.  No pedal edema, cyanosis, or clubbing. No calf tenderness or Homan's sign.  NEUROLOGIC: The patient is alert and oriented x 3. Cranial nerves II through XII are grossly intact with no focal sensorimotor deficit. Muscle strength 5/5 in all extremities. Sensation intact. Gait not checked. PSYCHIATRIC:  Normal affect, mood, thought content. SKIN: Warm, dry, and intact without obvious rash, lesion, or ulcer.    Labs on Admission:  CBC:  Recent Labs Lab 02/15/17 2107  WBC 13.4*  NEUTROABS 9.6*  HGB 15.4  HCT 45.1  MCV 80.3  PLT 286   Basic Metabolic Panel:  Recent Labs Lab 02/15/17 2107  NA 136  K 4.1  CL 102  CO2 26  GLUCOSE 113*  BUN 16  CREATININE 1.20  CALCIUM 9.1   GFR: Estimated Creatinine Clearance: 97.4 mL/min (by C-G formula based on SCr of 1.2 mg/dL). Liver Function Tests:  Recent Labs Lab 02/15/17 2107  AST 46*  ALT 73*  ALKPHOS 63  BILITOT 0.6  PROT 8.4*  ALBUMIN 4.4   No results for input(s): LIPASE, AMYLASE in the last 168 hours. No results for input(s): AMMONIA in the last 168 hours. Coagulation Profile: No results for input(s): INR, PROTIME in the last 168 hours. Cardiac Enzymes: No results for input(s): CKTOTAL, CKMB, CKMBINDEX, TROPONINI in the last 168  hours. BNP (last 3 results) No results for input(s): PROBNP in the last 8760 hours. HbA1C: No results for input(s): HGBA1C in the last 72 hours. CBG: No results for input(s): GLUCAP in the last 168 hours. Lipid Profile: No results for input(s): CHOL, HDL, LDLCALC, TRIG, CHOLHDL, LDLDIRECT in the last 72 hours. Thyroid Function Tests: No results for input(s): TSH, T4TOTAL, FREET4, T3FREE, THYROIDAB in the last 72 hours. Anemia Panel: No results for input(s): VITAMINB12, FOLATE, FERRITIN, TIBC, IRON, RETICCTPCT in the last 72 hours. Urine analysis: No results found for: COLORURINE, APPEARANCEUR, LABSPEC, PHURINE, GLUCOSEU, HGBUR, BILIRUBINUR, KETONESUR, PROTEINUR, UROBILINOGEN, NITRITE, LEUKOCYTESUR Sepsis Labs: @LABRCNTIP (procalcitonin:4,lacticidven:4) )No results found for this or any previous visit (from the past 240 hour(s)).   Radiological Exams on Admission: Dg Hand Complete Right  Result Date: 02/15/2017 CLINICAL DATA:  41 y/o  M; and swelling.  History of IV drug abuse. EXAM: RIGHT HAND - COMPLETE 3+ VIEW COMPARISON:  None. FINDINGS: No acute fracture or dislocation. No bony erosion or articular abnormality identified. Soft tissue swelling in the metacarpal region. IMPRESSION: No acute fracture or dislocation. No bony erosion or articular abnormality identified. Soft tissue swelling in the metacarpal region. Electronically Signed   By: Mitzi Hansen M.D.   On: 02/15/2017 23:48    Assessment/Plan  This is a 41 y.o. male with a history of IVDA now being admitted with:  #. Cellulitis of the right hand -  Admit to inpatient - IV antibiotics: Zosyn, Vanco - IV fluid hydration - Pain control with NSAIDs. AVOID NARCOTICS - Consider ortho/hand consult if needed  - Follow up blood cultures - Repeat CBC in am.  #. IVDA - Social work for maintenance of sobriety.    Admission status: Inpatient IV Fluids: NS Diet/Nutrition: Regular Consults called: None  DVT Px:  SCDs  and early ambulation. Code Status: Full Code  Disposition Plan: To home in 1-2 days  All the records are reviewed and case discussed with ED provider. Management plans discussed with the patient and/or family who express understanding and agree with plan of care.  Makennah Omura D.O. on 02/15/2017 at 11:55 PM Between 7am to 6pm - Pager - [phone] After 6pm go to [email or web address] - password [email or web address] [facility] [place] [facility] Office [phone] CC: Primary care physician; Patient, No Pcp Per   02/15/2017, 11:55 PM

## 2017-02-16 LAB — CBC
HEMATOCRIT: 41.3 % (ref 40.0–52.0)
Hemoglobin: 14.2 g/dL (ref 13.0–18.0)
MCH: 28.1 pg (ref 26.0–34.0)
MCHC: 34.4 g/dL (ref 32.0–36.0)
MCV: 81.8 fL (ref 80.0–100.0)
PLATELETS: 233 10*3/uL (ref 150–440)
RBC: 5.06 MIL/uL (ref 4.40–5.90)
RDW: 14.7 % — AB (ref 11.5–14.5)
WBC: 13.1 10*3/uL — AB (ref 3.8–10.6)

## 2017-02-16 MED ORDER — ONDANSETRON HCL 4 MG/2ML IJ SOLN: 4.0000 mg | Freq: Four times a day (QID) | INTRAMUSCULAR | Status: DC | PRN

## 2017-02-16 MED ORDER — SULFAMETHOXAZOLE-TRIMETHOPRIM 800-160 MG PO TABS
1.0000 | ORAL_TABLET | Freq: Two times a day (BID) | ORAL | Status: DC
  Administered 2017-02-16: 1 via ORAL
  Filled 2017-02-16: qty 1

## 2017-02-16 MED ORDER — SENNOSIDES-DOCUSATE SODIUM 8.6-50 MG PO TABS: 1.0000 | ORAL_TABLET | Freq: Every evening | ORAL | Status: DC | PRN

## 2017-02-16 MED ORDER — ACETAMINOPHEN 650 MG RE SUPP: 650.0000 mg | Freq: Four times a day (QID) | RECTAL | Status: DC | PRN

## 2017-02-16 MED ORDER — IPRATROPIUM BROMIDE 0.02 % IN SOLN: 0.5000 mg | Freq: Four times a day (QID) | RESPIRATORY_TRACT | Status: DC | PRN

## 2017-02-16 MED ORDER — METOCLOPRAMIDE HCL 5 MG/ML IJ SOLN
10.0000 mg | Freq: Once | INTRAMUSCULAR | Status: AC
  Administered 2017-02-16: 10 mg via INTRAVENOUS

## 2017-02-16 MED ORDER — VANCOMYCIN HCL 10 G IV SOLR
1250.0000 mg | Freq: Three times a day (TID) | INTRAVENOUS | Status: DC
  Administered 2017-02-16: 1250 mg via INTRAVENOUS
  Filled 2017-02-16 (×2): qty 1250

## 2017-02-16 MED ORDER — KETOROLAC TROMETHAMINE 30 MG/ML IJ SOLN: 30.0000 mg | Freq: Four times a day (QID) | INTRAMUSCULAR | Status: DC | PRN

## 2017-02-16 MED ORDER — MAGNESIUM CITRATE PO SOLN
1.0000 | Freq: Once | ORAL | Status: DC | PRN
  Filled 2017-02-16: qty 296

## 2017-02-16 MED ORDER — ACETAMINOPHEN 325 MG PO TABS: 650.0000 mg | ORAL_TABLET | Freq: Four times a day (QID) | ORAL | Status: DC | PRN

## 2017-02-16 MED ORDER — METOCLOPRAMIDE HCL 5 MG/ML IJ SOLN
INTRAMUSCULAR | Status: AC
  Administered 2017-02-16: 10 mg via INTRAVENOUS
  Filled 2017-02-16: qty 2

## 2017-02-16 MED ORDER — ONDANSETRON HCL 4 MG PO TABS: 4.0000 mg | ORAL_TABLET | Freq: Four times a day (QID) | ORAL | Status: DC | PRN

## 2017-02-16 MED ORDER — IBUPROFEN 600 MG PO TABS: 600.0000 mg | ORAL_TABLET | Freq: Three times a day (TID) | ORAL | 0 refills | Status: DC

## 2017-02-16 MED ORDER — SULFAMETHOXAZOLE-TRIMETHOPRIM 800-160 MG PO TABS: 1.0000 | ORAL_TABLET | Freq: Two times a day (BID) | ORAL | 0 refills | Status: DC

## 2017-02-16 MED ORDER — BISACODYL 5 MG PO TBEC: 5.0000 mg | DELAYED_RELEASE_TABLET | Freq: Every day | ORAL | Status: DC | PRN

## 2017-02-16 MED ORDER — PIPERACILLIN-TAZOBACTAM 3.375 G IVPB
3.3750 g | Freq: Three times a day (TID) | INTRAVENOUS | Status: DC
  Filled 2017-02-16: qty 50

## 2017-02-16 MED ORDER — SODIUM CHLORIDE 0.9 % IV SOLN
INTRAVENOUS | Status: DC
  Administered 2017-02-16: 02:00:00 via INTRAVENOUS

## 2017-02-16 MED ORDER — IBUPROFEN 400 MG PO TABS
600.0000 mg | ORAL_TABLET | Freq: Three times a day (TID) | ORAL | Status: DC
  Administered 2017-02-16: 600 mg via ORAL
  Filled 2017-02-16: qty 2

## 2017-02-16 MED ORDER — ALBUTEROL SULFATE (2.5 MG/3ML) 0.083% IN NEBU: 2.5000 mg | INHALATION_SOLUTION | Freq: Four times a day (QID) | RESPIRATORY_TRACT | Status: DC | PRN

## 2017-02-16 NOTE — Progress Notes (Signed)
Patient is alert and oriented and able to verbalize needs. No complaints of pain at this time. Vital signs stable. PIV removed. Discharge instructions gone over with patient at this time. Printed AVS given to patient. Patient verbalized understanding of all discharge instructions and where to pick up medications from. Girlfriend will transport patient home.  Suzan SlickAlison L Marquette Blodgett, RN

## 2017-02-16 NOTE — Discharge Instructions (Signed)
Keep hand elevated F/u urgent care or ER if the redness gets worse or you have high grade fever  Stop using iV drugs

## 2017-02-16 NOTE — Progress Notes (Signed)
Pharmacy Antibiotic Note  Joaquim NamMatthew Cunning is a 41 y.o. male admitted on 02/15/2017 with cellulitis.  Pharmacy has been consulted for vanc/zosyn dosing.  Plan: Patient received vanc 1g IV and zosyn 3.375g IV x 1 in the ED  Will continue vanc 1.25g IV q8h w/ 6 hour stack dose. Will draw a vanc trough 5/13 @ 0500 prior to 4th dose. Will start zosyn 3.375g IV q8h Ke 0.085 T1/2 8 hours Goal trough 15 - 20 mcg/mL  Height: 5\' 10"  (177.8 cm) Weight: 227 lb (103 kg) IBW/kg (Calculated) : 73  Temp (24hrs), Avg:99.1 F (37.3 C), Min:98.9 F (37.2 C), Max:99.2 F (37.3 C)   Recent Labs Lab 02/15/17 2107 02/16/17 0313  WBC 13.4* 13.1*  CREATININE 1.20  --   LATICACIDVEN 1.0  --     Estimated Creatinine Clearance: 97.4 mL/min (by C-G formula based on SCr of 1.2 mg/dL).    No Known Allergies   Thank you for allowing pharmacy to be a part of this patient's care.  Thomasene Rippleavid Viana Sleep, PharmD, BCPS Clinical Pharmacist 02/16/2017

## 2017-02-16 NOTE — Discharge Summary (Signed)
[facility] [place] at [facility]   PATIENT NAME: Robert NamMatthew Kleiman    MR#:  [id number]  DATE OF BIRTH:  1975/11/12  DATE OF ADMISSION:  02/15/2017 ADMITTING PHYSICIAN: Jon GillsAlexis Hugelmeyer, DO  DATE OF DISCHARGE: 02/16/17  PRIMARY CARE PHYSICIAN: Patient, No Pcp Per    ADMISSION DIAGNOSIS:  IVDU (intravenous drug user) [F19.90] Cellulitis, unspecified cellulitis site [L03.90]  DISCHARGE DIAGNOSIS:  Right hand cellulitis secondary to using needles for IV drugs   SECONDARY DIAGNOSIS:  History reviewed. No pertinent past medical history.  HOSPITAL COURSE:    41 y.o. male with a history of IVDA now being admitted with:  #. Cellulitis of the right hand - IV antibiotics: Zosyn, Vanco---change to oral bactrim -no fever Wbc 13 k- Pain control with NSAIDs. AVOID NARCOTICS - No  blood cultures were sent from ER. -remains afebrile  #. IVDA - pt advised abstinence and he and his girlfriend agrees.  Overall stable D/c home  Pt advised to kept hand elevated and w/f fever, and increasing redness or pulsatile swelling.  He is advised to f/u urgent care or ER if symptoms worsen. Voiced understanding CONSULTS OBTAINED:    DRUG ALLERGIES:  No Known Allergies  DISCHARGE MEDICATIONS:   Current Discharge Medication List    START taking these medications   Details  ibuprofen (ADVIL,MOTRIN) 600 MG tablet Take 1 tablet (600 mg total) by mouth 3 (three) times daily. For 3-4 days and then as needed Qty: 30 tablet, Refills: 0    sulfamethoxazole-trimethoprim (BACTRIM DS,SEPTRA DS) 800-160 MG tablet Take 1 tablet by mouth every 12 (twelve) hours. Qty: 20 tablet, Refills: 0      CONTINUE these medications which have NOT CHANGED   Details  docusate sodium (COLACE) 100 MG capsule Take 1 tablet once or twice daily as needed for constipation while taking narcotic pain medicine Qty: 30 capsule, Refills: 0      STOP taking these medications     HYDROcodone-acetaminophen (NORCO/VICODIN) 5-325 MG tablet         If you experience worsening of your admission symptoms, develop shortness of breath, life threatening emergency, suicidal or homicidal thoughts you must seek medical attention immediately by calling 911 or calling your MD immediately  if symptoms less severe.  You Must read complete instructions/literature along with all the possible adverse reactions/side effects for all the Medicines you take and that have been prescribed to you. Take any new Medicines after you have completely understood and accept all the possible adverse reactions/side effects.   Please note  You were cared for by a hospitalist during your hospital stay. If you have any questions about your discharge medications or the care you received while you were in the hospital after you are discharged, you can call the unit and asked to speak with the hospitalist on call if the hospitalist that took care of you is not available. Once you are discharged, your primary care physician will handle any further medical issues. Please note that NO REFILLS for any discharge medications will be authorized once you are discharged, as it is imperative that you return to your primary care physician (or establish a relationship with a primary care physician if you do not have one) for your aftercare needs so that they can reassess your need for medications and monitor your lab values. Today   SUBJECTIVE   No fever. No pain on the right hand unless touched  VITAL SIGNS:  Blood pressure 111/62, pulse 85, temperature 98.9 F (  37.2 C), temperature source Oral, resp. rate 18, height 5\' 10"  (1.778 m), weight 103 kg (227 lb), SpO2 96 %.  I/O:   Intake/Output Summary (Last 24 hours) at 02/16/17 0734 Last data filed at 02/16/17 0457  Gross per 24 hour  Intake            462.5 ml  Output                0 ml  Net            462.5 ml    PHYSICAL EXAMINATION:  GENERAL:  41  y.o.-year-old patient lying in the bed with no acute distress.  EYES: Pupils equal, round, reactive to light and accommodation. No scleral icterus. Extraocular muscles intact.  HEENT: Head atraumatic, normocephalic. Oropharynx and nasopharynx clear.  NECK:  Supple, no jugular venous distention. No thyroid enlargement, no tenderness.  LUNGS: Normal breath sounds bilaterally, no wheezing, rales,rhonchi or crepitation. No use of accessory muscles of respiration.  CARDIOVASCULAR: S1, S2 normal. No murmurs, rubs, or gallops.  ABDOMEN: Soft, non-tender, non-distended. Bowel sounds present. No organomegaly or mass.  EXTREMITIES: No pedal edema, cyanosis, or clubbing.  Right hand mild erythema with swelling and couple abrasion marks . No draining for any abraision. No pulsatile swelling. More inudrative feeling. Good radial pulse NEUROLOGIC: Cranial nerves II through XII are intact. Muscle strength 5/5 in all extremities. Sensation intact. Gait not checked.  PSYCHIATRIC: The patient is alert and oriented x 3.  SKIN: No obvious rash, lesion, or ulcer.   DATA REVIEW:   CBC   Recent Labs Lab 02/16/17 0313  WBC 13.1*  HGB 14.2  HCT 41.3  PLT 233    Chemistries   Recent Labs Lab 02/15/17 2107  NA 136  K 4.1  CL 102  CO2 26  GLUCOSE 113*  BUN 16  CREATININE 1.20  CALCIUM 9.1  AST 46*  ALT 73*  ALKPHOS 63  BILITOT 0.6    Microbiology Results   No results found for this or any previous visit (from the past 240 hour(s)).  RADIOLOGY:  Dg Hand Complete Right  Result Date: 02/15/2017 CLINICAL DATA:  41 y/o  M; and swelling.  History of IV drug abuse. EXAM: RIGHT HAND - COMPLETE 3+ VIEW COMPARISON:  None. FINDINGS: No acute fracture or dislocation. No bony erosion or articular abnormality identified. Soft tissue swelling in the metacarpal region. IMPRESSION: No acute fracture or dislocation. No bony erosion or articular abnormality identified. Soft tissue swelling in the metacarpal  region. Electronically Signed   By: Mitzi Hansen M.D.   On: 02/15/2017 23:48     Management plans discussed with the patient, family and they are in agreement.  CODE STATUS:     Code Status Orders        Start     Ordered   02/16/17 0128  Full code  Continuous     02/16/17 0127    Code Status History    Date Active Date Inactive Code Status Order ID Comments User Context   This patient has a current code status but no historical code status.      TOTAL TIME TAKING CARE OF THIS PATIENT: *40* minutes.    Eugene Isadore M.D on 02/16/2017 at 7:34 AM  Between 7am to 6pm - Pager - [phone] After 6pm go to [email or web address] - password [email or web address]  [facility]  Office  [phone]  CC: Primary care physician; Patient, No Pcp Per

## 2017-02-17 LAB — HIV ANTIBODY (ROUTINE TESTING W REFLEX): HIV Screen 4th Generation wRfx: NONREACTIVE

## 2017-02-18 ENCOUNTER — Encounter: Payer: Self-pay | Admitting: Emergency Medicine

## 2017-02-18 ENCOUNTER — Telehealth: Payer: Self-pay | Admitting: Emergency Medicine

## 2017-02-18 ENCOUNTER — Inpatient Hospital Stay
Admission: EM | Admit: 2017-02-18 | Discharge: 2017-02-19 | DRG: 603 | Disposition: A | Discharge status: Home / Self Care | Payer: Self-pay | Attending: Internal Medicine | Admitting: Internal Medicine

## 2017-02-18 DIAGNOSIS — F1910 Other psychoactive substance abuse, uncomplicated: Secondary | ICD-10-CM | POA: Diagnosis present

## 2017-02-18 DIAGNOSIS — L02511 Cutaneous abscess of right hand: Secondary | ICD-10-CM | POA: Diagnosis present

## 2017-02-18 DIAGNOSIS — L03113 Cellulitis of right upper limb: Principal | ICD-10-CM | POA: Diagnosis present

## 2017-02-18 DIAGNOSIS — R609 Edema, unspecified: Secondary | ICD-10-CM | POA: Diagnosis present

## 2017-02-18 HISTORY — DX: Other psychoactive substance use, unspecified, uncomplicated: F19.90

## 2017-02-18 LAB — COMPREHENSIVE METABOLIC PANEL
ALBUMIN: 3.8 g/dL (ref 3.5–5.0)
ALK PHOS: 49 U/L (ref 38–126)
ALT: 49 U/L (ref 17–63)
AST: 34 U/L (ref 15–41)
Anion gap: 7 (ref 5–15)
BILIRUBIN TOTAL: 0.3 mg/dL (ref 0.3–1.2)
BUN: 15 mg/dL (ref 6–20)
CALCIUM: 8.9 mg/dL (ref 8.9–10.3)
CO2: 23 mmol/L (ref 22–32)
Chloride: 109 mmol/L (ref 101–111)
Creatinine, Ser: 1.06 mg/dL (ref 0.61–1.24)
GFR calc Af Amer: >60 mL/min (ref >60)
GFR calc non Af Amer: >60 mL/min (ref >60)
GLUCOSE: 105 mg/dL — AB (ref 65–99)
Potassium: 4.2 mmol/L (ref 3.5–5.1)
Sodium: 139 mmol/L (ref 135–145)
TOTAL PROTEIN: 7.8 g/dL (ref 6.5–8.1)

## 2017-02-18 LAB — BUN: BUN: 16 mg/dL (ref 6–20)

## 2017-02-18 LAB — CBC
HCT: 41.9 % (ref 40.0–52.0)
Hemoglobin: 14.2 g/dL (ref 13.0–18.0)
MCH: 27.8 pg (ref 26.0–34.0)
MCHC: 33.9 g/dL (ref 32.0–36.0)
MCV: 82 fL (ref 80.0–100.0)
Platelets: 238 10*3/uL (ref 150–440)
RBC: 5.11 MIL/uL (ref 4.40–5.90)
RDW: 14.4 % (ref 11.5–14.5)
WBC: 8.5 10*3/uL (ref 3.8–10.6)

## 2017-02-18 MED ORDER — PIPERACILLIN-TAZOBACTAM 3.375 G IVPB
INTRAVENOUS | Status: AC
  Administered 2017-02-18: 3.375 g via INTRAVENOUS
  Filled 2017-02-18: qty 50

## 2017-02-18 MED ORDER — KETOROLAC TROMETHAMINE 30 MG/ML IJ SOLN: 30.0000 mg | Freq: Four times a day (QID) | INTRAMUSCULAR | Status: DC | PRN

## 2017-02-18 MED ORDER — POLYETHYLENE GLYCOL 3350 17 G PO PACK
17.0000 g | PACK | Freq: Every day | ORAL | Status: DC | PRN
  Filled 2017-02-18: qty 1

## 2017-02-18 MED ORDER — PIPERACILLIN-TAZOBACTAM 3.375 G IVPB
3.3750 g | Freq: Once | INTRAVENOUS | Status: AC
  Administered 2017-02-18: 3.375 g via INTRAVENOUS
  Filled 2017-02-18: qty 50

## 2017-02-18 MED ORDER — ALBUTEROL SULFATE (2.5 MG/3ML) 0.083% IN NEBU: 2.5000 mg | INHALATION_SOLUTION | RESPIRATORY_TRACT | Status: DC | PRN

## 2017-02-18 MED ORDER — ACETAMINOPHEN 650 MG RE SUPP
650.0000 mg | Freq: Four times a day (QID) | RECTAL | Status: DC | PRN
  Filled 2017-02-18: qty 1

## 2017-02-18 MED ORDER — IBUPROFEN 400 MG PO TABS: 400.0000 mg | ORAL_TABLET | Freq: Four times a day (QID) | ORAL | Status: DC | PRN

## 2017-02-18 MED ORDER — VANCOMYCIN HCL 10 G IV SOLR
2000.0000 mg | INTRAVENOUS | Status: AC
  Administered 2017-02-18: 2000 mg via INTRAVENOUS
  Filled 2017-02-18: qty 2000

## 2017-02-18 MED ORDER — ONDANSETRON HCL 4 MG PO TABS
4.0000 mg | ORAL_TABLET | Freq: Four times a day (QID) | ORAL | Status: DC | PRN
  Filled 2017-02-18: qty 1

## 2017-02-18 MED ORDER — PIPERACILLIN-TAZOBACTAM 3.375 G IVPB
3.3750 g | Freq: Three times a day (TID) | INTRAVENOUS | Status: DC
  Administered 2017-02-18 – 2017-02-19 (×3): 3.375 g via INTRAVENOUS
  Filled 2017-02-18 (×7): qty 50

## 2017-02-18 MED ORDER — ENOXAPARIN SODIUM 40 MG/0.4ML ~~LOC~~ SOLN
40.0000 mg | Freq: Every day | SUBCUTANEOUS | Status: DC
  Administered 2017-02-18: 40 mg via SUBCUTANEOUS
  Filled 2017-02-18 (×2): qty 0.4

## 2017-02-18 MED ORDER — ONDANSETRON HCL 4 MG/2ML IJ SOLN: 4.0000 mg | Freq: Four times a day (QID) | INTRAMUSCULAR | Status: DC | PRN

## 2017-02-18 MED ORDER — VANCOMYCIN HCL IN DEXTROSE 1-5 GM/200ML-% IV SOLN: 1000.0000 mg | Freq: Once | INTRAVENOUS | Status: DC

## 2017-02-18 MED ORDER — VANCOMYCIN HCL 10 G IV SOLR
1250.0000 mg | Freq: Three times a day (TID) | INTRAVENOUS | Status: DC
  Administered 2017-02-18 – 2017-02-19 (×2): 1250 mg via INTRAVENOUS
  Filled 2017-02-18 (×5): qty 1250

## 2017-02-18 MED ORDER — ACETAMINOPHEN 325 MG PO TABS: 650.0000 mg | ORAL_TABLET | Freq: Four times a day (QID) | ORAL | Status: DC | PRN

## 2017-02-18 NOTE — Progress Notes (Signed)
ANTIBIOTIC CONSULT NOTE - INITIAL  Pharmacy Consult for Vancomycin and Zosyn  Indication: Wound infection/cellulitis   No Known Allergies  Patient Measurements: Height: 5\' 10"  (177.8 cm) Weight: 257 lb (116.6 kg) IBW/kg (Calculated) : 73 Adjusted Body Weight:   Vital Signs: Temp: 97.8 F (36.6 C) (05/14 1527) Temp Source: Oral (05/14 1527) BP: 135/83 (05/14 1527) Pulse Rate: 74 (05/14 1527) Intake/Output from previous day: No intake/output data recorded. Intake/Output from this shift: No intake/output data recorded.  Labs:  Recent Labs  02/15/17 2107 02/16/17 0313 02/18/17 1206  WBC 13.4* 13.1* 8.5  HGB 15.4 14.2 14.2  PLT 286 233 238  CREATININE 1.20  --  1.06   Estimated Creatinine Clearance: 117.3 mL/min (by C-G formula based on SCr of 1.06 mg/dL). No results for input(s): VANCOTROUGH, VANCOPEAK, VANCORANDOM, GENTTROUGH, GENTPEAK, GENTRANDOM, TOBRATROUGH, TOBRAPEAK, TOBRARND, AMIKACINPEAK, AMIKACINTROU, AMIKACIN in the last 72 hours.   Microbiology: No results found for this or any previous visit (from the past 720 hour(s)).  Medical History: Past Medical History:  Diagnosis Date  . IVDU (intravenous drug user)     Medications:  Prescriptions Prior to Admission  Medication Sig Dispense Refill Last Dose  . ibuprofen (ADVIL,MOTRIN) 600 MG tablet Take 1 tablet (600 mg total) by mouth 3 (three) times daily. For 3-4 days and then as needed 30 tablet 0 02/18/2017 at 0830  . Multiple Vitamin (MULTIVITAMIN) tablet Take 1 tablet by mouth daily.   02/17/2017 at 0830  . docusate sodium (COLACE) 100 MG capsule Take 1 tablet once or twice daily as needed for constipation while taking narcotic pain medicine (Patient not taking: Reported on 02/15/2017) 30 capsule 0 Not Taking at Unknown time  . sulfamethoxazole-trimethoprim (BACTRIM DS,SEPTRA DS) 800-160 MG tablet Take 1 tablet by mouth every 12 (twelve) hours. 20 tablet 0 02/18/2017 at 0830   Scheduled:  . enoxaparin  (LOVENOX) injection  40 mg Subcutaneous QHS   Assessment: Pharmacy consulted to dose Vancomycin and Zosyn in this 41 year cellulitis of right hand with possible abscess. Patient failed outpatient Bactrim  Goal of Therapy:  Vancomycin trough level 15-20 mcg/ml  Plan:  Will give Vancomycin 2 g IV x 1. Will give Vancomycin 1250 mg IV q8 hours. Vancomycin Trough level ordered to 5/15 @ 2230 Vancomycin Trough level.   Zosyn: Patient received Zosyn @ ~1300. Will start Zosyn 3.375 g IV q8 hours.   Pharmacy to follow.   Halah Whiteside D 02/18/2017,4:00 PM

## 2017-02-18 NOTE — ED Notes (Signed)
Pt has swelling to R hand, pt sts he was seen and admitted for same last week. Pt sts that he used needle on hand to use drugs, denies other injury to hand. Pt sts that swelling has continued and that "white pus" came out of wound when squeezed

## 2017-02-18 NOTE — ED Provider Notes (Signed)
[facility] Emergency Department Provider Note    First MD Initiated Contact with Patient 02/18/17 1201     (approximate)  I have reviewed the triage vital signs and the nursing notes.   HISTORY  Chief Complaint Wound Infection and Cellulitis    HPI Robert Cobb is a 41 y.o. male with history of IV drug use last use approximately one week ago. Patient states that he did inject in his right hand at the time. Patient was seen and evaluated [facility] diagnosed with cellulitis of the right hand and admitted on 02/15/2017. Patient was discharged home on oral antibiotics however he states since yesterday he is noted worsening redness right hand extending up to the right forearm. Patient denies any fever. Patient states his current pain score is 1 out of 10.   Past Medical History:  Diagnosis Date  . IVDU (intravenous drug user)     Patient Active Problem List   Diagnosis Date Noted  . Cellulitis of right hand 02/15/2017    Past Surgical History:  Procedure Laterality Date  . Orbital socket surgery      Prior to Admission medications   Medication Sig Start Date End Date Taking? Authorizing Provider  docusate sodium (COLACE) 100 MG capsule Take 1 tablet once or twice daily as needed for constipation while taking narcotic pain medicine Patient not taking: Reported on 02/15/2017 01/25/16   Loleta RoseForbach, Cory, MD  ibuprofen (ADVIL,MOTRIN) 600 MG tablet Take 1 tablet (600 mg total) by mouth 3 (three) times daily. For 3-4 days and then as needed 02/16/17   Enedina FinnerPatel, Sona, MD  sulfamethoxazole-trimethoprim (BACTRIM DS,SEPTRA DS) 800-160 MG tablet Take 1 tablet by mouth every 12 (twelve) hours. 02/16/17   Enedina FinnerPatel, Sona, MD    Allergies No known drug allergies No family history on file.  Social History Social History  Substance Use Topics  . Smoking status: Never Smoker  . Smokeless tobacco: Never Used  . Alcohol use No     Comment: rarely    Review of  Systems Constitutional: No fever/chills Eyes: No visual changes. ENT: No sore throat. Cardiovascular: Denies chest pain. Respiratory: Denies shortness of breath. Gastrointestinal: No abdominal pain.  No nausea, no vomiting.  No diarrhea.  No constipation. Genitourinary: Negative for dysuria. Musculoskeletal: Negative for back pain. Integumentary: Negative for rash.Positive for right hand/forearm erythema and swelling Neurological: Negative for headaches, focal weakness or numbness.   ____________________________________________   PHYSICAL EXAM:  VITAL SIGNS: ED Triage Vitals  Enc Vitals Group     BP 02/18/17 1107 (!) 151/78     Pulse Rate 02/18/17 1107 75     Resp 02/18/17 1107 18     Temp 02/18/17 1107 97.7 F (36.5 C)     Temp Source 02/18/17 1107 Oral     SpO2 02/18/17 1107 99 %     Weight 02/18/17 1107 257 lb (116.6 kg)     Height 02/18/17 1107 5\' 10"  (1.778 m)     Head Circumference --      Peak Flow --      Pain Score 02/18/17 1109 1     Pain Loc --      Pain Edu? --      Excl. in GC? --     Constitutional: Alert and oriented. Well appearing and in no acute distress. Eyes: Conjunctivae are normal. PERRL. EOMI. Head: Atraumatic. Mouth/Throat: Mucous membranes are moist.  Oropharynx non-erythematous. Neck: No stridor.  Cardiovascular: Normal rate, regular rhythm. Good peripheral circulation. Grossly  normal heart sounds. Respiratory: Normal respiratory effort.  No retractions. Lungs CTAB. Gastrointestinal: Soft and nontender. No distention.  Musculoskeletal: Dorsal right hand erythema extending into the proximal forearm. Positive swelling dorsal right hand  Neurologic:  Normal speech and language. No gross focal neurologic deficits are appreciated.  Skin: Dorsal right hand erythema extending to the proximal forearm. Psychiatric: Mood and affect are normal. Speech and behavior are normal.  ____________________________________________   LABS (all labs ordered  are listed, but only abnormal results are displayed)  Labs Reviewed  COMPREHENSIVE METABOLIC PANEL - Abnormal; Notable for the following:       Result Value   Glucose, Bld 105 (*)    All other components within normal limits  CULTURE, BLOOD (ROUTINE X 2)  CULTURE, BLOOD (ROUTINE X 2)  CBC    Procedures   ____________________________________________   INITIAL IMPRESSION / ASSESSMENT AND PLAN / ED COURSE  Pertinent labs & imaging results that were available during my care of the patient were reviewed by me and considered in my medical decision making (see chart for details).   Patient with right hand cellulitis with failed outpatient by mouth antibiotic management. Patient received IV vancomycin and Zosyn the emergency department. Patient discussed with Dr. Penelope Coop for hospital admission for further evaluation and management.     ____________________________________________  FINAL CLINICAL IMPRESSION(S) / ED DIAGNOSES  Final diagnoses:  Cellulitis of hand, right     MEDICATIONS GIVEN DURING THIS VISIT:  Medications  piperacillin-tazobactam (ZOSYN) IVPB 3.375 g (3.375 g Intravenous New Bag/Given 02/18/17 1319)  vancomycin (VANCOCIN) IVPB 1000 mg/200 mL premix (not administered)     NEW OUTPATIENT MEDICATIONS STARTED DURING THIS VISIT:  New Prescriptions   No medications on file    Modified Medications   No medications on file    Discontinued Medications   No medications on file     Note:  This document was prepared using Dragon voice recognition software and may include unintentional dictation errors.    Darci Current, MD 02/18/17 [phone]

## 2017-02-18 NOTE — Clinical Social Work Note (Signed)
Clinical Social Work Assessment  Patient Details  Name: Robert Cobb MRN: [id number] Date of Birth: 16-Sep-1976  Date of referral:  02/18/17               Reason for consult:  [employer], Substance Use/ETOH Abuse                Permission sought to share information with:    Permission granted to share information::     Name::        Agency::     Relationship::     Contact Information:     Housing/Transportation Living arrangements for the past 2 months:  Single Family Home Source of Information:  Patient, Other (Comment Required) Paramedic ) Patient Interpreter Needed:  None Criminal Activity/Legal Involvement Pertinent to Current Situation/Hospitalization:  No - Comment as needed Significant Relationships:  Significant Other Lives with:  Significant Other Do you feel safe going back to the place where you live?  Yes Need for family participation in patient care:  No (Coment)  Care giving concerns:  Patient lives in [place] with his fiance Robert Cobb.    Social Worker assessment / plan:  Holiday representative (Steward) reviewed chart and noted that patient has a history of IVDU and is a readmission. CSW met with patient and his fiance Robert Cobb was at bedside. Patient was alert and oriented X4 and was laying in the bed. CSW introduced self and explained role of CSW department. Patient reported that he is temporarily unemployed and is about to start a job after his fiance Robert Cobb delivers their baby. Robert Cobb is due in 5 weeks. Patient reported that he does not have insurance and has been denied medicaid because he does not have children in the home. Patient reported that he can get health insurance after he has been working at his new job for 90 days. Patient reported that he has a history of heroin use and recently got out of inpatient rehab/ detox at [facility]. Patient stated the [facility] program worked well for him. CSW provided patient with a list of outpatient substance abuse resources.  Patient reported that it will be hard for him to go to outpatient rehab because of his work hours. Patient and his fiance reported that they are having a girl and have all the baby supplies needed. Per Robert Cobb she has health insurance and [facility] and is receiving prenatal care at [facility]. Patient and his fiance reported no other needs or concerns at this time. CSW will continue to follow and assist as needed.     Employment status:  Temporary, Unemployed Insurance information:  Self Pay (Medicaid Pending) PT Recommendations:  Not assessed at this time Information / Referral to community resources:  Outpatient Substance Abuse Treatment Options  Patient/Family's Response to care:  Patient accepted substance abuse resources.   Patient/Family's Understanding of and Emotional Response to Diagnosis, Current Treatment, and Prognosis:  Patient and fiance were very pleasant and thanked CSW for visit.   Emotional Assessment Appearance:  Appears stated age Attitude/Demeanor/Rapport:    Affect (typically observed):  Accepting, Adaptable, Overwhelmed Orientation:  Oriented to Self, Oriented to Place, Oriented to  Time, Oriented to Situation Alcohol / Substance use:  Illicit Drugs Psych involvement (Current and /or in the community):  No (Comment)  Discharge Needs  Concerns to be addressed:  Discharge Planning Concerns Readmission within the last 30 days:  Yes Current discharge risk:  Substance Abuse Barriers to Discharge:  Continued Medical Work up   [employer], Veronia Beets,  LCSW 02/18/2017, 3:58 PM

## 2017-02-18 NOTE — ED Notes (Signed)
Admitting MD at bedside.

## 2017-02-18 NOTE — ED Triage Notes (Signed)
Pt states he cut his right hand last week when he relapsed, has been treated with antibiotics for the past few days for infection, now concerned for cellulitis, redness on right hand noted, as well as swelling. Pt states he has been pushing on his hand and has been giving a lot of "white stuff out."

## 2017-02-18 NOTE — Plan of Care (Signed)
Problem: Physical Regulation: Goal: Ability to avoid or minimize complications of infection will improve Outcome: Progressing Patient has no limitations from cellulitis of R hand at this time.

## 2017-02-18 NOTE — ED Notes (Signed)
Dr Manson PasseyBrown in with pt in triage to assess infection sight, order obtained.

## 2017-02-18 NOTE — H&P (Signed)
[employer] [facility] - [place] at [facility]   PATIENT NAME: Robert Cobb    MR#:  [id number]  DATE OF BIRTH:  June 01, 1976  DATE OF ADMISSION:  02/18/2017  PRIMARY CARE PHYSICIAN: Patient, No Pcp Per   REQUESTING/REFERRING PHYSICIAN: Dr. Manson Passey  CHIEF COMPLAINT:   Chief Complaint  Patient presents with  . Wound Infection  . Cellulitis    HISTORY OF PRESENT ILLNESS:  Robert Cobb  is a 41 y.o. male with a known history of IV drug use was recently in the hospital for right hand cellulitis and discharged home on Bactrim returns due to worsening redness and swelling. He has noticed some pus draining from dorsum of hand. Here in the emergency room patient was given IV vancomycin and Zosyn. Case discussed with Dr. Rosita Kea of orthopedics. Patient is being admitted for IV antibiotics. May need I&D.  PAST MEDICAL HISTORY:   Past Medical History:  Diagnosis Date  . IVDU (intravenous drug user)     PAST SURGICAL HISTORY:   Past Surgical History:  Procedure Laterality Date  . Orbital socket surgery      SOCIAL HISTORY:   Social History  Substance Use Topics  . Smoking status: Never Smoker  . Smokeless tobacco: Never Used  . Alcohol use No     Comment: rarely    FAMILY HISTORY:  No family history on file. Mother is alive. Father deceased. No significant medical history.  DRUG ALLERGIES:  No Known Allergies  REVIEW OF SYSTEMS:   Review of Systems  Constitutional: Positive for malaise/fatigue. Negative for chills and fever.  HENT: Negative for sore throat.   Eyes: Negative for blurred vision, double vision and pain.  Respiratory: Negative for cough, hemoptysis, shortness of breath and wheezing.   Cardiovascular: Negative for chest pain, palpitations, orthopnea and leg swelling.  Gastrointestinal: Negative for abdominal pain, constipation, diarrhea, heartburn, nausea and vomiting.  Genitourinary: Negative for dysuria and hematuria.  Musculoskeletal: Positive for  joint pain. Negative for back pain.  Skin: Negative for rash.  Neurological: Positive for weakness. Negative for sensory change, speech change, focal weakness and headaches.  Endo/Heme/Allergies: Does not bruise/bleed easily.  Psychiatric/Behavioral: Negative for depression. The patient is not nervous/anxious.     MEDICATIONS AT HOME:   Prior to Admission medications   Medication Sig Start Date End Date Taking? Authorizing Provider  ibuprofen (ADVIL,MOTRIN) 600 MG tablet Take 1 tablet (600 mg total) by mouth 3 (three) times daily. For 3-4 days and then as needed 02/16/17  Yes Enedina Finner, MD  Multiple Vitamin (MULTIVITAMIN) tablet Take 1 tablet by mouth daily.   Yes [provider]  docusate sodium (COLACE) 100 MG capsule Take 1 tablet once or twice daily as needed for constipation while taking narcotic pain medicine Patient not taking: Reported on 02/15/2017 01/25/16   Loleta Rose, MD  sulfamethoxazole-trimethoprim (BACTRIM DS,SEPTRA DS) 800-160 MG tablet Take 1 tablet by mouth every 12 (twelve) hours. 02/16/17   Enedina Finner, MD     VITAL SIGNS:  Blood pressure (!) 151/78, pulse 75, temperature 97.7 F (36.5 C), temperature source Oral, resp. rate 18, height 5\' 10"  (1.778 m), weight 116.6 kg (257 lb), SpO2 99 %.  PHYSICAL EXAMINATION:  Physical Exam  GENERAL:  41 y.o.-year-old patient lying in the bed with no acute distress.  EYES: Pupils equal, round, reactive to light and accommodation. No scleral icterus. Extraocular muscles intact.  HEENT: Head atraumatic, normocephalic. Oropharynx and nasopharynx clear. No oropharyngeal erythema, moist oral mucosa  NECK:  Supple, no  jugular venous distention. No thyroid enlargement, no tenderness.  LUNGS: Normal breath sounds bilaterally, no wheezing, rales, rhonchi. No use of accessory muscles of respiration.  CARDIOVASCULAR: S1, S2 normal. No murmurs, rubs, or gallops.  ABDOMEN: Soft, nontender, nondistended. Bowel sounds present. No  organomegaly or mass.  EXTREMITIES: Right hand swelling and redness. Some spontaneous drainage from dorsum of hand. Able to flex his hand. Some redness up the forearm. No swelling of wrist or elbow. NEUROLOGIC: Cranial nerves II through XII are intact. No focal Motor or sensory deficits appreciated b/l PSYCHIATRIC: The patient is alert and oriented x 3. Good affect.  SKIN: No obvious rash, lesion, or ulcer.   LABORATORY PANEL:   CBC  Recent Labs Lab 02/18/17 1206  WBC 8.5  HGB 14.2  HCT 41.9  PLT 238   ------------------------------------------------------------------------------------------------------------------  Chemistries   Recent Labs Lab 02/18/17 1206  NA 139  K 4.2  CL 109  CO2 23  GLUCOSE 105*  BUN 15  CREATININE 1.06  CALCIUM 8.9  AST 34  ALT 49  ALKPHOS 49  BILITOT 0.3   ------------------------------------------------------------------------------------------------------------------  Cardiac Enzymes No results for input(s): TROPONINI in the last 168 hours. ------------------------------------------------------------------------------------------------------------------  RADIOLOGY:  No results found.   IMPRESSION AND PLAN:   * Right hand cellulitis. Possible abscess? Start IV vancomycin and Zosyn. Failed outpatient treatment with Bactrim. Dr. Menz/Dr. Joice LoftsPoggi team aware. Admit to medical bed. Pain medications ordered as needed  * IV drug use. Patient counseled.  * DVT prophylaxis with Lovenox  All the records are reviewed and case discussed with ED provider. Management plans discussed with the patient, family and they are in agreement.  CODE STATUS: FULL CODE  TOTAL TIME TAKING CARE OF THIS PATIENT: 40 minutes.   Milagros LollSudini, Erving Sassano R M.D on 02/18/2017 at 2:12 PM  Between 7am to 6pm - Pager - [phone]  After 6pm go to [email or web address] - password [email or web address]  [facility] [place] [facility]  Office  [phone]  CC: Primary care physician;  Patient, No Pcp Per  Note: This dictation was prepared with Dragon dictation along with smaller phrase technology. Any transcriptional errors that result from this process are unintentional.

## 2017-02-18 NOTE — Plan of Care (Signed)
Problem: Pain Managment: Goal: General experience of comfort will improve Outcome: Progressing Patient states there is mild discomfort in R hand, no significant pain at this time.

## 2017-02-19 LAB — BASIC METABOLIC PANEL
ANION GAP: 6 (ref 5–15)
BUN: 13 mg/dL (ref 6–20)
CO2: 28 mmol/L (ref 22–32)
Calcium: 8.6 mg/dL — ABNORMAL LOW (ref 8.9–10.3)
Chloride: 107 mmol/L (ref 101–111)
Creatinine, Ser: 1.17 mg/dL (ref 0.61–1.24)
GLUCOSE: 93 mg/dL (ref 65–99)
POTASSIUM: 4.4 mmol/L (ref 3.5–5.1)
SODIUM: 141 mmol/L (ref 135–145)

## 2017-02-19 LAB — CBC
HEMATOCRIT: 42 % (ref 40.0–52.0)
Hemoglobin: 14.2 g/dL (ref 13.0–18.0)
MCH: 28.3 pg (ref 26.0–34.0)
MCHC: 33.8 g/dL (ref 32.0–36.0)
MCV: 83.6 fL (ref 80.0–100.0)
Platelets: 227 10*3/uL (ref 150–440)
RBC: 5.03 MIL/uL (ref 4.40–5.90)
RDW: 15.1 % — ABNORMAL HIGH (ref 11.5–14.5)
WBC: 6.3 10*3/uL (ref 3.8–10.6)

## 2017-02-19 MED ORDER — AMOXICILLIN-POT CLAVULANATE 875-125 MG PO TABS: 1.0000 | ORAL_TABLET | Freq: Two times a day (BID) | ORAL | 0 refills | Status: DC

## 2017-02-19 MED ORDER — VANCOMYCIN HCL 10 G IV SOLR
1250.0000 mg | Freq: Three times a day (TID) | INTRAVENOUS | Status: DC
  Filled 2017-02-19 (×3): qty 1250

## 2017-02-19 NOTE — Care Management Note (Signed)
Case Management Note  Patient Details  Name: Robert Cobb MRN: [id number] Date of Birth: 1975-10-30  Subjective/Objective:   Met with patient at bedside to discuss discharge planning. Application given for [facility] and [facility]. Email sent to both agencies.No other concerns identified. Case closed.                 Action/Plan:   Expected Discharge Date:  02/20/17               Expected Discharge Plan:  Home/Self Care  In-House Referral:     Discharge planning Services  CM Consult, [facility], Medication Assistance  Post Acute Care Choice:    Choice offered to:  Patient  DME Arranged:    DME Agency:     HH Arranged:    [place] Agency:     Status of Service:  Completed, signed off  If discussed at [employer], dates discussed:    Additional Comments:  Jolly Mango, RN 02/19/2017, 11:29 AM

## 2017-02-19 NOTE — Progress Notes (Signed)
Robert Cobb to be D/C'd Home per MD order.  Discussed with the patient and all questions fully answered.  VSS, Skin clean, dry and intact without evidence of skin break down, no evidence of skin tears noted. IV catheter discontinued intact. Site without signs and symptoms of complications. Dressing and pressure applied.  An After Visit Summary was printed and given to the patient. Patient received prescription.  D/c education completed with patient/family including follow up instructions, medication list, d/c activities limitations if indicated, with other d/c instructions as indicated by MD - patient able to verbalize understanding, all questions fully answered.   Patient instructed to return to ED, call 911, or call MD for any changes in condition.   Patient ambulated to private auto.  [name] 02/19/2017 5:35 PM

## 2017-02-19 NOTE — Progress Notes (Signed)
Pt called out for assistance, this Clinical research associatewriter responded. Pt stated that he had had an iv in his neck and that he became aggravated with it and ripped it out of his neck . Pt stated that he asked numerous nurses overnight to use ultrasound to start an IV in his arms where he wants it, but nobody did it. Pt showed this writer various bruises to his arms bilat and stated that the bruises were from previous iv start attempts. This Clinical research associatewriter made refierral to IV team for restart, MD also paged to inform.

## 2017-02-19 NOTE — Progress Notes (Signed)
[employer] [facility] - [place] at [facility]   PATIENT NAME: Robert Cobb    MR#:  [id number]  DATE OF BIRTH:  1976/07/20  SUBJECTIVE:   Patient was recently discharged for hand abscess and presents again for worsening of cellulitis and edema  REVIEW OF SYSTEMS:    Review of Systems  Constitutional: Negative for fever, chills weight loss HENT: Negative for ear pain, nosebleeds, congestion, facial swelling, rhinorrhea, neck pain, neck stiffness and ear discharge.   Respiratory: Negative for cough, shortness of breath, wheezing  Cardiovascular: Negative for chest pain, palpitations and leg swelling.  Gastrointestinal: Negative for heartburn, abdominal pain, vomiting, diarrhea or consitpation Genitourinary: Negative for dysuria, urgency, frequency, hematuria Musculoskeletal: Negative for back pain or joint pain Hand with swelling and pain Neurological: Negative for dizziness, seizures, syncope, focal weakness,  numbness and headaches.  Hematological: Does not bruise/bleed easily.  Psychiatric/Behavioral: Negative for hallucinations, confusion, dysphoric mood    Tolerating Diet: yes      DRUG ALLERGIES:  No Known Allergies  VITALS:  Blood pressure 121/74, pulse 72, temperature 98.3 F (36.8 C), temperature source Oral, resp. rate 18, height 5\' 10"  (1.778 m), weight 116.6 kg (257 lb), SpO2 100 %.  PHYSICAL EXAMINATION:  Constitutional: Appears well-developed and well-nourished. No distress. HENT: Normocephalic. Marland Kitchen. Oropharynx is clear and moist.  Eyes: Conjunctivae and EOM are normal. PERRLA, no scleral icterus.  Neck: Normal ROM. Neck supple. No JVD. No tracheal deviation. CVS: RRR, S1/S2 +, no murmurs, no gallops, no carotid bruit.  Pulmonary: Effort and breath sounds normal, no stridor, rhonchi, wheezes, rales.  Abdominal: Soft. BS +,  no distension, tenderness, rebound or guarding.  Musculoskeletal: Right hand with edema all the way up to the elbow and  tenderness. Seems like an area of fluctuance. Patient is able to extend and flex hand.   Neuro: Alert. CN 2-12 grossly intact. No focal deficits. Skin: Skin is warm and dry. No rash noted. Psychiatric: Normal mood and affect.      LABORATORY PANEL:   CBC  Recent Labs Lab 02/19/17 0443  WBC 6.3  HGB 14.2  HCT 42.0  PLT 227   ------------------------------------------------------------------------------------------------------------------  Chemistries   Recent Labs Lab 02/18/17 1206  02/19/17 0443  NA 139  --  141  K 4.2  --  4.4  CL 109  --  107  CO2 23  --  28  GLUCOSE 105*  --  93  BUN 15  < > 13  CREATININE 1.06  --  1.17  CALCIUM 8.9  --  8.6*  AST 34  --   --   ALT 49  --   --   ALKPHOS 49  --   --   BILITOT 0.3  --   --   < > = values in this interval not displayed. ------------------------------------------------------------------------------------------------------------------  Cardiac Enzymes No results for input(s): TROPONINI in the last 168 hours. ------------------------------------------------------------------------------------------------------------------  RADIOLOGY:  No results found.   ASSESSMENT AND PLAN:   41 year old male with history of IV drug abuse who presents for right hand cellulitis  1. Right hand cellulitis with abscess, failed outpatient Bactrim: Patient likely needs incision and drainage. Follow-up on orthopedic consult Continue IV vancomycin and Zosyn When necessary pain medications  2. IV drug abuse: Patient was counseled regarding stating from illicit drug use      Management plans discussed with the patient and he is in agreement.  CODE STATUS: full  TOTAL TIME TAKING CARE OF THIS PATIENT: 30 minutes.  POSSIBLE D/C tomorrow, DEPENDING ON CLINICAL CONDITION.   Analiese Krupka M.D on 02/19/2017 at 8:11 AM  Between 7am to 6pm - Pager - [phone] After 6pm go to [email or web address] - password [email or web address]  [facility]  [employer] [facility]  Office  [phone]  CC: Primary care physician; Patient, No Pcp Per  Note: This dictation was prepared with Dragon dictation along with smaller phrase technology. Any transcriptional errors that result from this process are unintentional.

## 2017-02-19 NOTE — Discharge Summary (Signed)
[facility] [place] at [facility]   PATIENT NAME: Robert Cobb    MR#:  [id number]  DATE OF BIRTH:  1976/02/02  DATE OF ADMISSION:  02/18/2017 ADMITTING PHYSICIAN: Milagros Loll, MD  DATE OF DISCHARGE: No discharge date for patient encounter.  PRIMARY CARE PHYSICIAN: Patient, No Pcp Per    ADMISSION DIAGNOSIS:  Cellulitis of hand, right [L03.113]  DISCHARGE DIAGNOSIS:  Active Problems:   Cellulitis of right hand   SECONDARY DIAGNOSIS:   Past Medical History:  Diagnosis Date  . IVDU (intravenous drug user)     HOSPITAL COURSE:  41 year old male with history of IV drug abuse who presents for right hand cellulitis  1. Right hand cellulitis with abscess , failed outpatient Bactrim: Patient was started on Vancomycin and Zosyn.The original plan was to have ORTHOPEDICS consult. However, patient's swelling and erythema along with absess drained on its own and had improved. He wanted to be discharged home. He will continue Bactrim with Augmentin. He was provided follow up information for Dr Hyacinth Meeker if the cellulitis returns.  2. IV drug abuse: Patient was counseled regarding stating from illicit drug use   DISCHARGE CONDITIONS AND DIET:  Stable regular doet  CONSULTS OBTAINED:  Treatment Team:  Deeann Saint, MD  DRUG ALLERGIES:  No Known Allergies  DISCHARGE MEDICATIONS:   Current Discharge Medication List    START taking these medications   Details  amoxicillin-clavulanate (AUGMENTIN) 875-125 MG tablet Take 1 tablet by mouth 2 (two) times daily. Qty: 10 tablet, Refills: 0      CONTINUE these medications which have NOT CHANGED   Details  Multiple Vitamin (MULTIVITAMIN) tablet Take 1 tablet by mouth daily.    sulfamethoxazole-trimethoprim (BACTRIM DS,SEPTRA DS) 800-160 MG tablet Take 1 tablet by mouth every 12 (twelve) hours. Qty: 20 tablet, Refills: 0      STOP taking these medications     ibuprofen (ADVIL,MOTRIN) 600 MG tablet      docusate sodium (COLACE) 100 MG capsule           Today   CHIEF COMPLAINT:  Doing well wants to home does not want to see ORTHO and swelling better   VITAL SIGNS:  Blood pressure 121/74, pulse 72, temperature 98.3 F (36.8 C), temperature source Oral, resp. rate 18, height 5\' 10"  (1.778 m), weight 116.6 kg (257 lb), SpO2 100 %.   REVIEW OF SYSTEMS:  Review of Systems  Constitutional: Negative.  Negative for chills, fever and malaise/fatigue.  HENT: Negative.  Negative for ear discharge, ear pain, hearing loss, nosebleeds and sore throat.   Eyes: Negative.  Negative for blurred vision and pain.  Respiratory: Negative.  Negative for cough, hemoptysis, shortness of breath and wheezing.   Cardiovascular: Negative.  Negative for chest pain, palpitations and leg swelling.  Gastrointestinal: Negative.  Negative for abdominal pain, blood in stool, diarrhea, nausea and vomiting.  Genitourinary: Negative.  Negative for dysuria.  Musculoskeletal: Negative.  Negative for back pain.  Skin:       Improved swelling  Neurological: Negative for dizziness, tremors, speech change, focal weakness, seizures and headaches.  Endo/Heme/Allergies: Negative.  Does not bruise/bleed easily.  Psychiatric/Behavioral: Negative.  Negative for depression, hallucinations and suicidal ideas.     PHYSICAL EXAMINATION:  GENERAL:  41 y.o.-year-old patient lying in the bed with no acute distress.  NECK:  Supple, no jugular venous distention. No thyroid enlargement, no tenderness.  LUNGS: Normal breath sounds bilaterally, no wheezing, rales,rhonchi  No use of accessory muscles of respiration.  CARDIOVASCULAR: S1, S2 normal. No murmurs, rubs, or gallops.  ABDOMEN: Soft, non-tender, non-distended. Bowel sounds present. No organomegaly or mass.  EXTREMITIES: No pedal edema, cyanosis, or clubbing.  PSYCHIATRIC: The patient is alert and oriented x 3.  SKIN: No obvious rash, lesion, or ulcer.   Right hand swelling  decreased no erythema DATA REVIEW:   CBC  Recent Labs Lab 02/19/17 0443  WBC 6.3  HGB 14.2  HCT 42.0  PLT 227    Chemistries   Recent Labs Lab 02/18/17 1206  02/19/17 0443  NA 139  --  141  K 4.2  --  4.4  CL 109  --  107  CO2 23  --  28  GLUCOSE 105*  --  93  BUN 15  < > 13  CREATININE 1.06  --  1.17  CALCIUM 8.9  --  8.6*  AST 34  --   --   ALT 49  --   --   ALKPHOS 49  --   --   BILITOT 0.3  --   --   < > = values in this interval not displayed.  Cardiac Enzymes No results for input(s): TROPONINI in the last 168 hours.  Microbiology Results  @MICRORSLT48 @  RADIOLOGY:  No results found.    Current Discharge Medication List    START taking these medications   Details  amoxicillin-clavulanate (AUGMENTIN) 875-125 MG tablet Take 1 tablet by mouth 2 (two) times daily. Qty: 10 tablet, Refills: 0      CONTINUE these medications which have NOT CHANGED   Details  Multiple Vitamin (MULTIVITAMIN) tablet Take 1 tablet by mouth daily.    sulfamethoxazole-trimethoprim (BACTRIM DS,SEPTRA DS) 800-160 MG tablet Take 1 tablet by mouth every 12 (twelve) hours. Qty: 20 tablet, Refills: 0      STOP taking these medications     ibuprofen (ADVIL,MOTRIN) 600 MG tablet      docusate sodium (COLACE) 100 MG capsule            Management plans discussed with the patient and he is in agreement. Stable for discharge home  Patient should follow up with dr Hyacinth Meekermiller  CODE STATUS:     Code Status Orders        Start     Ordered   02/18/17 1411  Full code  Continuous     02/18/17 1411    Code Status History    Date Active Date Inactive Code Status Order ID Comments User Context   02/16/2017  1:27 AM 02/16/2017 11:31 AM Full Code [id number]  Hugelmeyer, Jon GillsAlexis, DO Inpatient      TOTAL TIME TAKING CARE OF THIS PATIENT: 37 minutes.    Note: This dictation was prepared with Dragon dictation along with smaller phrase technology. Any transcriptional errors that  result from this process are unintentional.  Atheena Spano M.D on 02/19/2017 at 5:09 PM  Between 7am to 6pm - Pager - [phone] After 6pm go to [email or web address] - password [email or web address]  [facility]  Office  [phone]  CC: Primary care physician; Patient, No Pcp Per

## 2017-02-23 LAB — CULTURE, BLOOD (ROUTINE X 2)
CULTURE: NO GROWTH
Culture: NO GROWTH
Special Requests: ADEQUATE

## 2018-02-15 ENCOUNTER — Emergency Department
Admission: EM | Admit: 2018-02-15 | Discharge: 2018-02-15 | Disposition: A | Discharge status: Home / Self Care | Payer: 59 | Attending: Emergency Medicine | Admitting: Emergency Medicine

## 2018-02-15 ENCOUNTER — Encounter: Payer: Self-pay | Admitting: Emergency Medicine

## 2018-02-15 ENCOUNTER — Emergency Department: Payer: 59

## 2018-02-15 DIAGNOSIS — Y92009 Unspecified place in unspecified non-institutional (private) residence as the place of occurrence of the external cause: Secondary | ICD-10-CM | POA: Insufficient documentation

## 2018-02-15 DIAGNOSIS — Y939 Activity, unspecified: Secondary | ICD-10-CM | POA: Insufficient documentation

## 2018-02-15 DIAGNOSIS — Y999 Unspecified external cause status: Secondary | ICD-10-CM | POA: Insufficient documentation

## 2018-02-15 DIAGNOSIS — R0602 Shortness of breath: Secondary | ICD-10-CM | POA: Diagnosis not present

## 2018-02-15 DIAGNOSIS — S20211A Contusion of right front wall of thorax, initial encounter: Secondary | ICD-10-CM | POA: Diagnosis present

## 2018-02-15 DIAGNOSIS — W19XXXA Unspecified fall, initial encounter: Secondary | ICD-10-CM | POA: Diagnosis not present

## 2018-02-15 MED ORDER — HYDROMORPHONE HCL 1 MG/ML IJ SOLN
1.0000 mg | Freq: Once | INTRAMUSCULAR | Status: AC
  Administered 2018-02-15: 1 mg via INTRAMUSCULAR
  Filled 2018-02-15: qty 1

## 2018-02-15 MED ORDER — OXYCODONE-ACETAMINOPHEN 7.5-325 MG PO TABS: 1.0000 | ORAL_TABLET | Freq: Four times a day (QID) | ORAL | 0 refills | Status: DC | PRN

## 2018-02-15 MED ORDER — KETOROLAC TROMETHAMINE 60 MG/2ML IM SOLN
60.0000 mg | Freq: Once | INTRAMUSCULAR | Status: AC
  Administered 2018-02-15: 60 mg via INTRAMUSCULAR
  Filled 2018-02-15: qty 2

## 2018-02-15 MED ORDER — IBUPROFEN 800 MG PO TABS: 800.0000 mg | ORAL_TABLET | Freq: Three times a day (TID) | ORAL | 0 refills | Status: DC | PRN

## 2018-02-15 NOTE — ED Triage Notes (Signed)
Pt reports 2 days ago he was playing with the kids and fell on the ground and now with pain to right rib that worsens with deep breaths.

## 2018-02-15 NOTE — ED Provider Notes (Addendum)
[facility] Emergency Department Provider Note   ____________________________________________   None    (approximate)  I have reviewed the triage vital signs and the nursing notes.   HISTORY  Chief Complaint Shortness of Breath and Rib Injury    HPI Robert Cobb is a 42 y.o. male patient complaining of right anterior rib pain secondary to a fall while playing with his children 2 days ago.  Patient the pain increased with deep inspiration.  Patient rates the pain as a 10/10.  Patient described the pain as "acute aching".  Mild transient relief of her Vicodin from mother's prescription.  Past Medical History:  Diagnosis Date  . IVDU (intravenous drug user)     Patient Active Problem List   Diagnosis Date Noted  . Cellulitis of right hand 02/15/2017    Past Surgical History:  Procedure Laterality Date  . Orbital socket surgery      Prior to Admission medications   Medication Sig Start Date End Date Taking? Authorizing Provider  amoxicillin-clavulanate (AUGMENTIN) 875-125 MG tablet Take 1 tablet by mouth 2 (two) times daily. 02/19/17   Adrian Saran, MD  ibuprofen (ADVIL,MOTRIN) 800 MG tablet Take 1 tablet (800 mg total) by mouth every 8 (eight) hours as needed for moderate pain. 02/15/18   Joni Reining, PA-C  Multiple Vitamin (MULTIVITAMIN) tablet Take 1 tablet by mouth daily.    [provider]  oxyCODONE-acetaminophen (PERCOCET) 7.5-325 MG tablet Take 1 tablet by mouth every 6 (six) hours as needed for severe pain. 02/15/18   Joni Reining, PA-C  sulfamethoxazole-trimethoprim (BACTRIM DS,SEPTRA DS) 800-160 MG tablet Take 1 tablet by mouth every 12 (twelve) hours. 02/16/17   Enedina Finner, MD    Allergies Patient has no known allergies.  No family history on file.  Social History Social History   Tobacco Use  . Smoking status: Never Smoker  . Smokeless tobacco: Never Used  Substance Use Topics  . Alcohol use: No    Comment:  rarely  . Drug use: No    Comment: heroine, percocet, hydrocodone    Review of Systems Constitutional: No fever/chills Eyes: No visual changes. ENT: No sore throat. Cardiovascular: Denies chest pain. Respiratory: Denies shortness of breath. Gastrointestinal: No abdominal pain.  No nausea, no vomiting.  No diarrhea.  No constipation. Genitourinary: Negative for dysuria. Musculoskeletal: Right anterior and lateral chest wall pain. Skin: Negative for rash. Neurological: Negative for headaches, focal weakness or numbness.   ____________________________________________   PHYSICAL EXAM:  VITAL SIGNS: ED Triage Vitals  Enc Vitals Group     BP 02/15/18 1217 (!) 148/97     Pulse Rate 02/15/18 1217 97     Resp 02/15/18 1217 20     Temp 02/15/18 1217 98.4 F (36.9 C)     Temp Source 02/15/18 1217 Oral     SpO2 02/15/18 1217 96 %     Weight 02/15/18 1218 265 lb (120.2 kg)     Height 02/15/18 1218  (1.778 m)     Head Circumference --      Peak Flow --      Pain Score 02/15/18 1218 10     Pain Loc --      Pain Edu? --      Excl. in GC? --    Constitutional: Alert and oriented. Well appearing and in no acute distress. Cardiovascular: Normal rate, regular rhythm. Grossly normal heart sounds.  Good peripheral circulation. Respiratory: Normal respiratory effort.  No retractions. Lungs CTAB.  Gastrointestinal: Soft and nontender. No distention. No abdominal bruits. No CVA tenderness. Musculoskeletal: No obvious chest wall deformity.  Patient is moderate guarding palpation of ribs 7 and 8. Neurologic:  Normal speech and language. No gross focal neurologic deficits are appreciated. No gait instability. Skin:  Skin is warm, dry and intact. No rash noted. Psychiatric: Mood and affect are normal. Speech and behavior are normal.  ____________________________________________   LABS (all labs ordered are listed, but only abnormal results are displayed)  Labs Reviewed - No data to  display ____________________________________________  EKG   ____________________________________________  RADIOLOGY    Official radiology report(s): Dg Ribs Unilateral W/chest Right  Result Date: 02/15/2018 CLINICAL DATA:  Larey Seat 2 days ago onto RIGHT side pushing RIGHT elbow into chest under the RIGHT breast region, painful to take a deep breath since the fall EXAM: RIGHT RIBS AND CHEST - 3+ VIEW COMPARISON:  Chest radiographs 01/25/2016 FINDINGS: Normal heart size, mediastinal contours, and pulmonary vascularity. Decreased lung volumes with bibasilar atelectasis. Upper lungs clear. No pleural effusion or pneumothorax. Osseous mineralization normal. BB placed at site of symptoms lower RIGHT chest. No rib fracture or bone destruction. IMPRESSION: Bibasilar atelectasis. No acute RIGHT rib abnormalities. Electronically Signed   By: Ulyses Southward M.D.   On: 02/15/2018 12:52    ____________________________________________   PROCEDURES  Procedure(s) performed: None  Procedures  Critical Care performed: No  ____________________________________________   INITIAL IMPRESSION / ASSESSMENT AND PLAN / ED COURSE  As part of my medical decision making, I reviewed the following data within the electronic MEDICAL RECORD NUMBER    Right lateral rib pain secondary to contusion.  Discussed negative x-ray findings with patient.  Patient given discharge care instruction.  Patient advised take medication as directed.  Patient given a work note and advised to follow-up with the community health clinic if no improvement in 3 days.      ____________________________________________   FINAL CLINICAL IMPRESSION(S) / ED DIAGNOSES  Final diagnoses:  Rib contusion, right, initial encounter     ED Discharge Orders        Ordered    oxyCODONE-acetaminophen (PERCOCET) 7.5-325 MG tablet  Every 6 hours PRN     02/15/18 1335    ibuprofen (ADVIL,MOTRIN) 800 MG tablet  Every 8 hours PRN     02/15/18 1335         Note:  This document was prepared using Dragon voice recognition software and may include unintentional dictation errors.    Joni Reining, PA-C 02/15/18 1339    Joni Reining, PA-C 02/15/18 1340    Minna Antis, MD 02/15/18 [phone]

## 2018-04-17 ENCOUNTER — Emergency Department
Admission: EM | Admit: 2018-04-17 | Discharge: 2018-04-18 | Disposition: A | Discharge status: Home / Self Care | Payer: 59 | Attending: Emergency Medicine | Admitting: Emergency Medicine

## 2018-04-17 ENCOUNTER — Other Ambulatory Visit: Payer: Self-pay

## 2018-04-17 DIAGNOSIS — M542 Cervicalgia: Secondary | ICD-10-CM | POA: Insufficient documentation

## 2018-04-17 DIAGNOSIS — Z79899 Other long term (current) drug therapy: Secondary | ICD-10-CM | POA: Insufficient documentation

## 2018-04-17 MED ORDER — KETOROLAC TROMETHAMINE 30 MG/ML IJ SOLN
30.0000 mg | Freq: Once | INTRAMUSCULAR | Status: AC
Start: 2018-04-18 — End: 2018-04-18
  Administered 2018-04-18: 30 mg via INTRAVENOUS
  Filled 2018-04-17: qty 1

## 2018-04-17 MED ORDER — DIPHENHYDRAMINE HCL 50 MG/ML IJ SOLN
25.0000 mg | Freq: Once | INTRAMUSCULAR | Status: AC
  Administered 2018-04-17: 25 mg via INTRAVENOUS
  Filled 2018-04-17: qty 1

## 2018-04-17 MED ORDER — METOCLOPRAMIDE HCL 5 MG/ML IJ SOLN
10.0000 mg | Freq: Once | INTRAMUSCULAR | Status: AC
  Administered 2018-04-17: 10 mg via INTRAVENOUS
  Filled 2018-04-17: qty 2

## 2018-04-17 NOTE — ED Triage Notes (Signed)
Pt arrives to ED via POV from home with c/o neck pain x10-12 hrs. Pt reports h/x of headaches, pt reports some dizziness that has resolved, but still having neck pain. No reported injury or trauma, (+) nausea, but denies vomiting.

## 2018-04-18 ENCOUNTER — Emergency Department: Payer: 59

## 2018-04-18 LAB — CBC
HCT: 44.5 % (ref 40.0–52.0)
Hemoglobin: 15.1 g/dL (ref 13.0–18.0)
MCH: 27.6 pg (ref 26.0–34.0)
MCHC: 34 g/dL (ref 32.0–36.0)
MCV: 81.3 fL (ref 80.0–100.0)
PLATELETS: 211 10*3/uL (ref 150–440)
RBC: 5.47 MIL/uL (ref 4.40–5.90)
RDW: 14.9 % — ABNORMAL HIGH (ref 11.5–14.5)
WBC: 5.8 10*3/uL (ref 3.8–10.6)

## 2018-04-18 LAB — COMPREHENSIVE METABOLIC PANEL
ALK PHOS: 62 U/L (ref 38–126)
ALT: 74 U/L — AB (ref 0–44)
AST: 54 U/L — ABNORMAL HIGH (ref 15–41)
Albumin: 4.4 g/dL (ref 3.5–5.0)
Anion gap: 11 (ref 5–15)
BUN: 16 mg/dL (ref 6–20)
CALCIUM: 9.1 mg/dL (ref 8.9–10.3)
CHLORIDE: 107 mmol/L (ref 98–111)
CO2: 24 mmol/L (ref 22–32)
CREATININE: 1.81 mg/dL — AB (ref 0.61–1.24)
GFR calc non Af Amer: 44 mL/min — ABNORMAL LOW (ref >60)
GFR, EST AFRICAN AMERICAN: 52 mL/min — AB (ref >60)
Glucose, Bld: 155 mg/dL — ABNORMAL HIGH (ref 70–99)
Potassium: 3.5 mmol/L (ref 3.5–5.1)
Sodium: 142 mmol/L (ref 135–145)
Total Bilirubin: 1.2 mg/dL (ref 0.3–1.2)
Total Protein: 8.1 g/dL (ref 6.5–8.1)

## 2018-04-18 MED ORDER — IOPAMIDOL (ISOVUE-300) INJECTION 61%
60.0000 mL | Freq: Once | INTRAVENOUS | Status: AC | PRN
  Administered 2018-04-18: 60 mL via INTRAVENOUS

## 2018-04-19 NOTE — ED Provider Notes (Signed)
[facility] Emergency Department Provider Note ___   First MD Initiated Contact with Patient 04/18/18 0011     (approximate)  I have reviewed the triage vital signs and the nursing notes.   HISTORY  Chief Complaint Neck Pain    HPI Robert Cobb is a 42 y.o. male with history of IV drug use most recently today presents to the emergency department with a large history of posterior neck pain.  Patient states that he had dizziness earlier however this is resolved.  Patient denies any history of trauma.  Patient denies any headache at present.  Patient denies any weakness numbness gait instability.  Patient admits to using "fentanyl or heroin" today   Past Medical History:  Diagnosis Date  . IVDU (intravenous drug user)     Patient Active Problem List   Diagnosis Date Noted  . Cellulitis of right hand 02/15/2017    Past Surgical History:  Procedure Laterality Date  . Orbital socket surgery      Prior to Admission medications   Medication Sig Start Date End Date Taking? Authorizing Provider  amoxicillin-clavulanate (AUGMENTIN) 875-125 MG tablet Take 1 tablet by mouth 2 (two) times daily. 02/19/17   Adrian Saran, MD  ibuprofen (ADVIL,MOTRIN) 800 MG tablet Take 1 tablet (800 mg total) by mouth every 8 (eight) hours as needed for moderate pain. 02/15/18   Joni Reining, PA-C  Multiple Vitamin (MULTIVITAMIN) tablet Take 1 tablet by mouth daily.    [provider]  oxyCODONE-acetaminophen (PERCOCET) 7.5-325 MG tablet Take 1 tablet by mouth every 6 (six) hours as needed for severe pain. 02/15/18   Joni Reining, PA-C  sulfamethoxazole-trimethoprim (BACTRIM DS,SEPTRA DS) 800-160 MG tablet Take 1 tablet by mouth every 12 (twelve) hours. 02/16/17   Enedina Finner, MD    Allergies No known drug allergies No family history on file.  Social History Social History   Tobacco Use  . Smoking status: Never Smoker  . Smokeless tobacco: Never Used    Substance Use Topics  . Alcohol use: No    Comment: rarely  . Drug use: No    Comment: heroine, percocet, hydrocodone    Review of Systems Constitutional: No fever/chills Eyes: No visual changes. ENT: No sore throat. Cardiovascular: Denies chest pain. Respiratory: Denies shortness of breath. Gastrointestinal: No abdominal pain.  No nausea, no vomiting.  No diarrhea.  No constipation. Genitourinary: Negative for dysuria. Musculoskeletal: Negative for neck pain.  Negative for back pain. Integumentary: Negative for rash. Neurological: Negative for headaches, focal weakness or numbness. Psychiatric:Positive for IV drug use  ____________________________________________   PHYSICAL EXAM:  VITAL SIGNS: ED Triage Vitals  Enc Vitals Group     BP 04/17/18 2343 118/83     Pulse Rate 04/17/18 2343 (!) 116     Resp 04/17/18 2343 16     Temp 04/17/18 2343 98 F (36.7 C)     Temp Source 04/17/18 2343 Oral     SpO2 04/17/18 2343 94 %     Weight 04/17/18 2347 103.4 kg (228 lb)     Height 04/17/18 2347 1.778 m (5\' 10" )     Head Circumference --      Peak Flow --      Pain Score 04/17/18 2347 10     Pain Loc --      Pain Edu? --      Excl. in GC? --     Constitutional: Alert and oriented.  Patient appears intoxicated eyes: Conjunctivae are normal.  Head: Atraumatic. Mouth/Throat: Mucous membranes are moist. Oropharynx non-erythematous. Neck: No stridor.  Diffuse tenderness with palpation of the cervical spine  cardiovascular: Normal rate, regular rhythm. Good peripheral circulation. Grossly normal heart sounds. Respiratory: Normal respiratory effort.  No retractions. Lungs CTAB. Gastrointestinal: Soft and nontender. No distention.  Musculoskeletal: No lower extremity tenderness nor edema. No gross deformities of extremities. Neurologic:  Normal speech and language. No gross focal neurologic deficits are appreciated.  Skin:  Skin is warm, dry and intact. No rash  noted. Psychiatric: Mood and affect are normal. Speech and behavior are normal.  ____________________________________________   LABS (all labs ordered are listed, but only abnormal results are displayed)  Labs Reviewed  CBC - Abnormal; Notable for the following components:      Result Value   RDW 14.9 (*)    All other components within normal limits  COMPREHENSIVE METABOLIC PANEL - Abnormal; Notable for the following components:   Glucose, Bld 155 (*)    Creatinine, Ser 1.81 (*)    AST 54 (*)    ALT 74 (*)    GFR calc non Af Amer 44 (*)    GFR calc Af Amer 52 (*)    All other components within normal limits   _______________  RADIOLOGY I, [place] Romie Keeble, personally viewed and evaluated these images (plain radiographs) as part of my medical decision making, as well as reviewing the written report by the radiologist.  ED MD interpretation: No acute process noted in the neck on CT Neck with contrast  Official radiology report(s): Ct Soft Tissue Neck W Contrast  Result Date: 04/18/2018 CLINICAL DATA:  Neck pain for 12 hours, headache. History of IV drug abuse. Assess for epidural abscess. EXAM: CT NECK WITH CONTRAST TECHNIQUE: Multidetector CT imaging of the neck was performed using the standard protocol following the bolus administration of intravenous contrast. CONTRAST:  60mL ISOVUE-300 IOPAMIDOL (ISOVUE-300) INJECTION 61% COMPARISON:  None. FINDINGS: PHARYNX AND LARYNX: Normal.  Widely patent airway. SALIVARY GLANDS: Normal. THYROID: Normal. LYMPH NODES: No lymphadenopathy by CT size criteria. VASCULAR: Normal. LIMITED INTRACRANIAL: Normal. VISUALIZED ORBITS: Normal. MASTOIDS AND VISUALIZED PARANASAL SINUSES: Well-aerated. SKELETON: Nonacute. No abnormal intracanalicular enhancement. Mild cervical spondylosis. UPPER CHEST: Lung apices are clear. No superior mediastinal lymphadenopathy. OTHER: None. IMPRESSION: No acute process in the neck. Electronically Signed   By: Awilda Metroourtnay   Bloomer M.D.   On: 04/18/2018 02:03      Procedures   ____________________________________________   INITIAL IMPRESSION / ASSESSMENT AND PLAN / ED COURSE  As part of my medical decision making, I reviewed the following data within the electronic MEDICAL RECORD NUMBER   42 year old male presenting with above-stated history and physical exam of posterior neck pain.  Patient admits to using fentanyl or heroin IV today.  Given concern for possible epidural abscess CT with contrast was performed which revealed no evidence of epidural abscess. ____________________________________________  FINAL CLINICAL IMPRESSION(S) / ED DIAGNOSES  Final diagnoses:  Neck pain     MEDICATIONS GIVEN DURING THIS VISIT:  Medications  ketorolac (TORADOL) 30 MG/ML injection 30 mg (30 mg Intravenous Given 04/18/18 0000)  metoCLOPramide (REGLAN) injection 10 mg (10 mg Intravenous Given 04/17/18 2358)  diphenhydrAMINE (BENADRYL) injection 25 mg (25 mg Intravenous Given 04/17/18 2359)  iopamidol (ISOVUE-300) 61 % injection 60 mL (60 mLs Intravenous Contrast Given 04/18/18 0134)     ED Discharge Orders    None       Note:  This document was prepared using Dragon voice recognition software and may  include unintentional dictation errors.    Darci Current, MD 04/19/18 [phone]

## 2018-05-24 ENCOUNTER — Other Ambulatory Visit: Payer: Self-pay

## 2018-05-24 ENCOUNTER — Emergency Department
Admission: EM | Admit: 2018-05-24 | Discharge: 2018-05-25 | Disposition: A | Discharge status: Home / Self Care | Payer: Self-pay | Attending: Emergency Medicine | Admitting: Emergency Medicine

## 2018-05-24 ENCOUNTER — Emergency Department: Payer: Self-pay

## 2018-05-24 DIAGNOSIS — T50901A Poisoning by unspecified drugs, medicaments and biological substances, accidental (unintentional), initial encounter: Secondary | ICD-10-CM | POA: Insufficient documentation

## 2018-05-24 DIAGNOSIS — Z79899 Other long term (current) drug therapy: Secondary | ICD-10-CM | POA: Insufficient documentation

## 2018-05-24 LAB — BASIC METABOLIC PANEL
Anion gap: 10 (ref 5–15)
BUN: 17 mg/dL (ref 6–20)
CHLORIDE: 102 mmol/L (ref 98–111)
CO2: 26 mmol/L (ref 22–32)
CREATININE: 1.28 mg/dL — AB (ref 0.61–1.24)
Calcium: 8.8 mg/dL — ABNORMAL LOW (ref 8.9–10.3)
GFR calc non Af Amer: >60 mL/min (ref >60)
Glucose, Bld: 94 mg/dL (ref 70–99)
POTASSIUM: 3.8 mmol/L (ref 3.5–5.1)
SODIUM: 138 mmol/L (ref 135–145)

## 2018-05-24 LAB — ETHANOL

## 2018-05-24 LAB — ACETAMINOPHEN LEVEL: Acetaminophen (Tylenol), Serum: <10 ug/mL — ABNORMAL LOW (ref 10–30)

## 2018-05-24 LAB — CBC
HEMATOCRIT: 41.1 % (ref 40.0–52.0)
HEMOGLOBIN: 13.9 g/dL (ref 13.0–18.0)
MCH: 27.7 pg (ref 26.0–34.0)
MCHC: 33.9 g/dL (ref 32.0–36.0)
MCV: 81.8 fL (ref 80.0–100.0)
Platelets: 235 10*3/uL (ref 150–440)
RBC: 5.03 MIL/uL (ref 4.40–5.90)
RDW: 14.8 % — ABNORMAL HIGH (ref 11.5–14.5)
WBC: 11.5 10*3/uL — ABNORMAL HIGH (ref 3.8–10.6)

## 2018-05-24 LAB — SALICYLATE LEVEL

## 2018-05-24 LAB — TROPONIN I

## 2018-05-24 MED ORDER — SODIUM CHLORIDE 0.9 % IV BOLUS
1000.0000 mL | Freq: Once | INTRAVENOUS | Status: AC
  Administered 2018-05-24: 1000 mL via INTRAVENOUS

## 2018-05-24 MED ORDER — KETOROLAC TROMETHAMINE 30 MG/ML IJ SOLN
30.0000 mg | Freq: Once | INTRAMUSCULAR | Status: AC
  Administered 2018-05-24: 30 mg via INTRAVENOUS
  Filled 2018-05-24: qty 1

## 2018-05-24 NOTE — ED Triage Notes (Signed)
Pt arrived from home via EMS with complaints of a drug overdose. Pt is not sure what he overdosed on but he does have access to Hydrocodone and suboxone. Ex girlfriend found pt in floor unresponsive, but when EMS arrived he was responsive. Pt has pinpoint pupils. Pt is now alert and oriented x 4. VS per EMS HR-120 BP-124/86. Pt vomited once in route to hospital. Pt was also having unrelated left knee pain for the past 2 weeks. Pt denies any pain. [place] in [place] [facility] [place].

## 2018-05-24 NOTE — ED Notes (Signed)
Placed pt on 2 L nasal cannula for comfort 

## 2018-05-25 NOTE — ED Notes (Signed)
Pt kept standing in doorway asking to leave, this RN explained to pt that his care was not finished, pt stated " I dont care..ill leave AMA.my 433 year old mother is outside waiting". This RN explained to pt that she would be in shortly to remove his IV, pt stated "I will take it out myself" and walked back to room. This RN followed pt to room with police escort and convinced pt to allow this RN to removed his IV. Once IV was removed pt left AMA without signing for his AMA discharge.

## 2018-05-25 NOTE — ED Provider Notes (Signed)
[facility] Emergency Department Provider Note   ____________________________________________   First MD Initiated Contact with Patient 05/24/18 2311     (approximate)  I have reviewed the triage vital signs and the nursing notes.   HISTORY  Chief Complaint Drug Overdose and Knee Pain (left sided knee pain x 2 weeks)    HPI Robert Cobb is a 42 y.o. male who comes into the hospital today with some weakness.  The patient states that he took some Suboxone for knee pain.  He reports that it was not working so he then took for 7-1/2 mg hydrocodone.  EMS reports that his girlfriend found him unresponsive in the bathroom but the patient states that he remembers his girlfriend coming into the bathroom.  He denies any chest pain shortness of breath and he reports that he does not think he passed out.  He does have some left medial knee pain which is had for about 6 weeks.  He has no insurance so he has not gone to follow-up.  The patient is here tonight for evaluation.  When EMS arrived to the house the patient was awake so he did not receive any Narcan.  He denies any suicidal ideation and states that he drank 2 beers yesterday.   Past Medical History:  Diagnosis Date  . IVDU (intravenous drug user)     Patient Active Problem List   Diagnosis Date Noted  . Cellulitis of right hand 02/15/2017    Past Surgical History:  Procedure Laterality Date  . EYE SURGERY    . Orbital socket surgery      Prior to Admission medications   Medication Sig Start Date End Date Taking? Authorizing Provider  amoxicillin-clavulanate (AUGMENTIN) 875-125 MG tablet Take 1 tablet by mouth 2 (two) times daily. 02/19/17   Adrian SaranMody, Sital, MD  ibuprofen (ADVIL,MOTRIN) 800 MG tablet Take 1 tablet (800 mg total) by mouth every 8 (eight) hours as needed for moderate pain. 02/15/18   Joni ReiningSmith, Ronald K, PA-C  Multiple Vitamin (MULTIVITAMIN) tablet Take 1 tablet by mouth daily.    [provider]  oxyCODONE-acetaminophen (PERCOCET) 7.5-325 MG tablet Take 1 tablet by mouth every 6 (six) hours as needed for severe pain. 02/15/18   Joni ReiningSmith, Ronald K, PA-C  sulfamethoxazole-trimethoprim (BACTRIM DS,SEPTRA DS) 800-160 MG tablet Take 1 tablet by mouth every 12 (twelve) hours. 02/16/17   Enedina FinnerPatel, Sona, MD    Allergies Patient has no known allergies.  History reviewed. No pertinent family history.  Social History Social History   Tobacco Use  . Smoking status: Never Smoker  . Smokeless tobacco: Never Used  Substance Use Topics  . Alcohol use: No    Comment: rarely  . Drug use: No    Comment: heroine, percocet, hydrocodone    Review of Systems  Constitutional: No fever/chills Eyes: No visual changes. ENT: No sore throat. Cardiovascular: Denies chest pain. Respiratory: Denies shortness of breath. Gastrointestinal: No abdominal pain.  No nausea,  Genitourinary: Negative for dysuria. Musculoskeletal: Negative for back pain. Skin: Negative for rash. Neurological: Negative for headaches, focal weakness or numbness.   ____________________________________________   PHYSICAL EXAM:  VITAL SIGNS: ED Triage Vitals  Enc Vitals Group     BP 05/24/18 2241 130/86     Pulse Rate 05/24/18 2241 94     Resp 05/24/18 2241 15     Temp 05/24/18 2241 98.2 F (36.8 C)     Temp Source 05/24/18 2241 Oral     SpO2 05/24/18 2241  98 %     Weight 05/24/18 2242 247 lb (112 kg)     Height 05/24/18 2242 5\' 10"  (1.778 m)     Head Circumference --      Peak Flow --      Pain Score 05/24/18 2242 0     Pain Loc --      Pain Edu? --      Excl. in [place]? --     Constitutional: Patient somnolent but arousable. Well appearing and in mild distress. Eyes: Conjunctivae are normal. PERRL. EOMI. pupils pinpoint Head: Atraumatic. Nose: No congestion/rhinnorhea. Mouth/Throat: Mucous membranes are moist.  Oropharynx non-erythematous. Cardiovascular: Normal rate, regular rhythm. Grossly normal  heart sounds.  Good peripheral circulation. Respiratory: Normal respiratory effort.  No retractions. Lungs CTAB. Gastrointestinal: Soft and nontender. No distention.   Musculoskeletal: No lower extremity tenderness nor edema.   Neurologic:  Normal speech and language.  Skin:  Skin is warm, dry and intact. Psychiatric: Mood and affect are normal.   ____________________________________________   LABS (all labs ordered are listed, but only abnormal results are displayed)  Labs Reviewed  CBC - Abnormal; Notable for the following components:      Result Value   WBC 11.5 (*)    RDW 14.8 (*)    All other components within normal limits  BASIC METABOLIC PANEL - Abnormal; Notable for the following components:   Creatinine, Ser 1.28 (*)    Calcium 8.8 (*)    All other components within normal limits  ACETAMINOPHEN LEVEL - Abnormal; Notable for the following components:   Acetaminophen (Tylenol), Serum <10 (*)    All other components within normal limits  TROPONIN I  ETHANOL  SALICYLATE LEVEL  URINE DRUG SCREEN, QUALITATIVE ([facility] ONLY)   ____________________________________________  EKG  ED ECG REPORT I, Rebecka ApleyWebster,  Yaritza Leist P, the attending physician, personally viewed and interpreted this ECG.   Date: 05/24/2018  EKG Time: 2237  Rate: 100  Rhythm: normal sinus rhythm  Axis: normal  Intervals:none  ST&T Change: none  ____________________________________________  RADIOLOGY  ED MD interpretation:  Left knee xray: no acute abnormality  Official radiology report(s): Dg Knee 2 Views Left  Result Date: 05/24/2018 CLINICAL DATA:  Patient with left knee pain for 2 weeks. Initial encounter. EXAM: LEFT KNEE - 1-2 VIEW COMPARISON:  None. FINDINGS: Normal anatomic alignment. No evidence for acute fracture or dislocation. Regional soft tissues are unremarkable. IMPRESSION: No acute osseous abnormality. Electronically Signed   By: Annia Beltrew  Davis M.D.   On: 05/24/2018 23:58     ____________________________________________   PROCEDURES  Procedure(s) performed: None  Procedures  Critical Care performed: No  ____________________________________________   INITIAL IMPRESSION / ASSESSMENT AND PLAN / ED COURSE  As part of my medical decision making, I reviewed the following data within the electronic MEDICAL RECORD NUMBER Notes from prior ED visits and [place] Controlled Substance Database   This is a 42 year old male who comes into the hospital today with a possible substance overdose.  The patient states that he was weak but could not tell me specifically when it started.  The patient though does admit that he took too much medication.  He has been taking hydrocodone illegally although he is on Suboxone.  We will check some blood work to include a CBC, BMP troponin and ethanol Tylenol acetaminophen level.  I will send the patient for an x-ray looking for injury to his knee.  The patient is sleepy so we will continue to monitor him until he is  more arousable.  The patient came out of the room and states that his mom is coming to get him and he wants to leave. The patient did no provide a urine. We attempted to talk to the patient but he eloped from the ER.       ____________________________________________   FINAL CLINICAL IMPRESSION(S) / ED DIAGNOSES  Final diagnoses:  Accidental drug overdose, initial encounter     ED Discharge Orders    None       Note:  This document was prepared using Dragon voice recognition software and may include unintentional dictation errors.    Rebecka Apley, MD 05/25/18 [phone]

## 2020-01-29 ENCOUNTER — Other Ambulatory Visit: Payer: Self-pay

## 2020-01-29 ENCOUNTER — Emergency Department
Admission: EM | Admit: 2020-01-29 | Discharge: 2020-01-29 | Disposition: A | Discharge status: Home / Self Care | Payer: Self-pay | Attending: Emergency Medicine | Admitting: Emergency Medicine

## 2020-01-29 ENCOUNTER — Encounter: Payer: Self-pay | Admitting: Emergency Medicine

## 2020-01-29 DIAGNOSIS — L02414 Cutaneous abscess of left upper limb: Secondary | ICD-10-CM | POA: Insufficient documentation

## 2020-01-29 DIAGNOSIS — L0291 Cutaneous abscess, unspecified: Secondary | ICD-10-CM

## 2020-01-29 DIAGNOSIS — Z23 Encounter for immunization: Secondary | ICD-10-CM | POA: Insufficient documentation

## 2020-01-29 MED ORDER — LIDOCAINE HCL (PF) 1 % IJ SOLN
5.0000 mL | Freq: Once | INTRAMUSCULAR | Status: DC
  Filled 2020-01-29: qty 5

## 2020-01-29 MED ORDER — TETANUS-DIPHTH-ACELL PERTUSSIS 5-2.5-18.5 LF-MCG/0.5 IM SUSP
0.5000 mL | Freq: Once | INTRAMUSCULAR | Status: AC
  Administered 2020-01-29: 12:00:00 0.5 mL via INTRAMUSCULAR
  Filled 2020-01-29: qty 0.5

## 2020-01-29 MED ORDER — SULFAMETHOXAZOLE-TRIMETHOPRIM 800-160 MG PO TABS: 1.0000 | ORAL_TABLET | Freq: Two times a day (BID) | ORAL | 0 refills | Status: DC

## 2020-01-29 MED ORDER — SULFAMETHOXAZOLE-TRIMETHOPRIM 800-160 MG PO TABS
1.0000 | ORAL_TABLET | Freq: Once | ORAL | Status: AC
Start: 2020-01-29 — End: 2020-01-29
  Administered 2020-01-29: 1 via ORAL
  Filled 2020-01-29: qty 1

## 2020-01-29 NOTE — Discharge Instructions (Signed)
Keep the wound clean, dry, and covered. Apply warm compresses over the dressing. Take the antibiotic as directed, until all pills are gone. Return for wound check as needed.

## 2020-01-29 NOTE — ED Notes (Signed)
Pt has abscess on left arm that is swollen, red, and warm to the touch. Pt denies fevers/chills but states that its numb. Pt state's he tried to drain it on his own at home.

## 2020-01-29 NOTE — ED Triage Notes (Signed)
Pt reports abscess to left arm for 7-8 days that is ready to be drained.

## 2020-01-29 NOTE — ED Provider Notes (Signed)
[facility] Emergency Department Provider Note ____________________________________________  Time seen: 1128  I have reviewed the triage vital signs and the nursing notes.  HISTORY  Chief Complaint  Abscess  HPI Robert Cobb is a 44 y.o. male presents himself to the ED for evaluation of a skin abscess for the last week.  Patient who is an admitted IV drug user, admits to skin popping over the area that is now erythematous, fluctuant, and indurated.  He admits to attempting to incise the area, but denies any benefit.  He presents now denies any interim fever, chills, or sweats.  He has some intermittent purulent drainage from the same area to the left upper arm.   Past Medical History:  Diagnosis Date  . IVDU (intravenous drug user)     Patient Active Problem List   Diagnosis Date Noted  . Cellulitis of right hand 02/15/2017    Past Surgical History:  Procedure Laterality Date  . EYE SURGERY    . Orbital socket surgery      Prior to Admission medications   Medication Sig Start Date End Date Taking? Authorizing Provider  Multiple Vitamin (MULTIVITAMIN) tablet Take 1 tablet by mouth daily.    [provider]  sulfamethoxazole-trimethoprim (BACTRIM DS) 800-160 MG tablet Take 1 tablet by mouth 2 (two) times daily. 01/29/20   Floyd Wade, Dannielle Karvonen, PA-C    Allergies Patient has no known allergies.  History reviewed. No pertinent family history.  Social History Social History   Tobacco Use  . Smoking status: Never Smoker  . Smokeless tobacco: Never Used  Substance Use Topics  . Alcohol use: No    Comment: rarely  . Drug use: No    Comment: heroine, percocet, hydrocodone    Review of Systems  Constitutional: Negative for fever. Cardiovascular: Negative for chest pain. Respiratory: Negative for shortness of breath. Gastrointestinal: Negative for abdominal pain, vomiting and diarrhea. Musculoskeletal: Negative for back pain. Skin:  Negative for rash. LUE abscess. Neurological: Negative for headaches, focal weakness or numbness. ____________________________________________  PHYSICAL EXAM:  VITAL SIGNS: ED Triage Vitals  Enc Vitals Group     BP 01/29/20 1109 115/66     Pulse Rate 01/29/20 1109 67     Resp --      Temp 01/29/20 1109 98.2 F (36.8 C)     Temp Source 01/29/20 1109 Oral     SpO2 01/29/20 1109 97 %     Weight 01/29/20 1107 247 lb (112 kg)     Height 01/29/20 1107 5\' 10"  (1.778 m)     Head Circumference --      Peak Flow --      Pain Score 01/29/20 1107 0     Pain Loc --      Pain Edu? --      Excl. in [place]? --     Constitutional: Alert and oriented. Well appearing and in no distress. Head: Normocephalic and atraumatic. Eyes: Conjunctivae are normal. Normal extraocular movements Cardiovascular: Normal rate, regular rhythm. Normal distal pulses. Respiratory: Normal respiratory effort.  Musculoskeletal: Nontender with normal range of motion in all extremities.  Neurologic:  Normal gait without ataxia. Normal speech and language. No gross focal neurologic deficits are appreciated. Skin:  Skin is warm, dry and intact. No rash noted. ____________________________________________  PROCEDURES  Tdap 0.5 mL IM Bactrim DS 1 p.o.  Marland Kitchen.Incision and Drainage  Date/Time: 01/29/2020 12:20 PM Performed by: Melvenia Needles, PA-C Authorized by: Melvenia Needles, PA-C  Consent:    Consent obtained:  Verbal   Consent given by:  Patient   Risks discussed:  Bleeding, pain and incomplete drainage Location:    Type:  Abscess   Size:  2   Location:  Upper extremity   Upper extremity location:  Arm   Arm location:  L upper arm Pre-procedure details:    Skin preparation:  Betadine Anesthesia (see MAR for exact dosages):    Anesthesia method:  Local infiltration   Local anesthetic:  Lidocaine 1% w/o epi Procedure type:    Complexity:  Simple Procedure details:    Incision types:  Single  straight   Incision depth:  Subcutaneous   Scalpel blade:  11   Wound management:  Probed and deloculated and irrigated with saline   Drainage:  Purulent   Drainage amount:  Copious   Wound treatment:  Wound left open   Packing materials:  1/4 in iodoform gauze   Amount 1/4" iodoform:  8 Post-procedure details:    Patient tolerance of procedure:  Tolerated well, no immediate complications  ____________________________________________  INITIAL IMPRESSION / ASSESSMENT AND PLAN / ED COURSE  Patient with ED evaluation of a 1 week abscess to the left upper extremity.  Patient admits to IV drug use, and presents with a wound that he attempted to I&D on his own.  He tolerated a local I&D procedure with meaningful expression of copious amounts of purulent drainage from the left upper arm.  The wound is packed and dressed as appropriate.  He is discharged with Bactrim instructions and return to the ED as needed for wound check in 3 days.  Robert Cobb was evaluated in Emergency Department on 01/29/2020 for the symptoms described in the history of present illness. He was evaluated in the context of the global COVID-19 pandemic, which necessitated consideration that the patient might be at risk for infection with the SARS-CoV-2 virus that causes COVID-19. Institutional protocols and algorithms that pertain to the evaluation of patients at risk for COVID-19 are in a state of rapid change based on information released by regulatory bodies including the CDC and federal and state organizations. These policies and algorithms were followed during the patient's care in the ED. ____________________________________________  FINAL CLINICAL IMPRESSION(S) / ED DIAGNOSES  Final diagnoses:  Abscess      Karmen Stabs, Charlesetta Ivory, PA-C 01/29/20 1351    Emily Filbert, MD 01/29/20 [phone]

## 2020-03-27 IMAGING — CT CT NECK W/ CM
4 of 5 series · 14 of 33 positions shown, 16 images · IV contrast (iopamidol)
Comparison: None.

CLINICAL DATA: Neck pain for 12 hours, headache. History of IV drug
abuse. Assess for epidural abscess.

EXAM:
CT NECK WITH CONTRAST
TECHNIQUE: Multidetector CT imaging of the neck was performed using the
standard protocol following the bolus administration of intravenous
contrast.
CONTRAST:  60mL B2LDAB-TZZ IOPAMIDOL (B2LDAB-TZZ) INJECTION 61%

[Series 2: axial neck · axial · 0.55mm/px · z∈[-286,-136]mm · 4 of 127 slices shown, 5 images]
[im 26/127  soft-tissue]
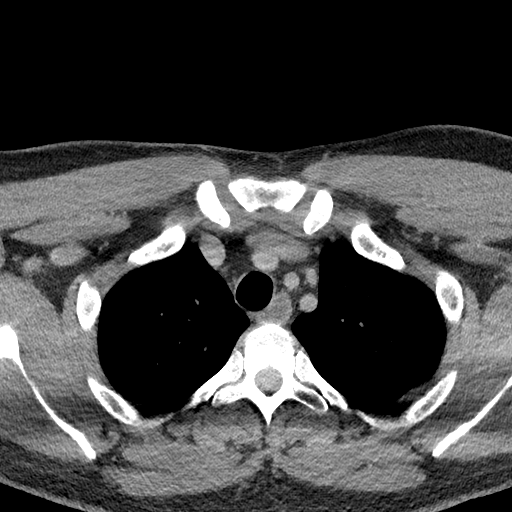
[im 26/127  bone]
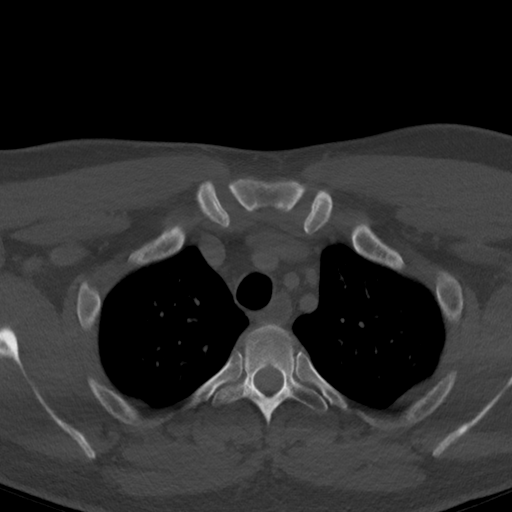
[im 51/127  bone]
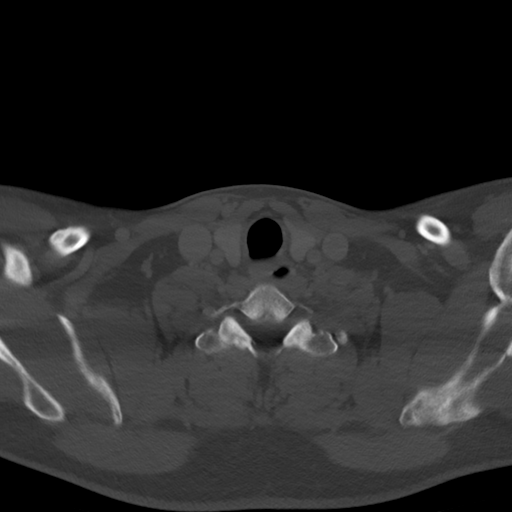
[im 76/127  bone]
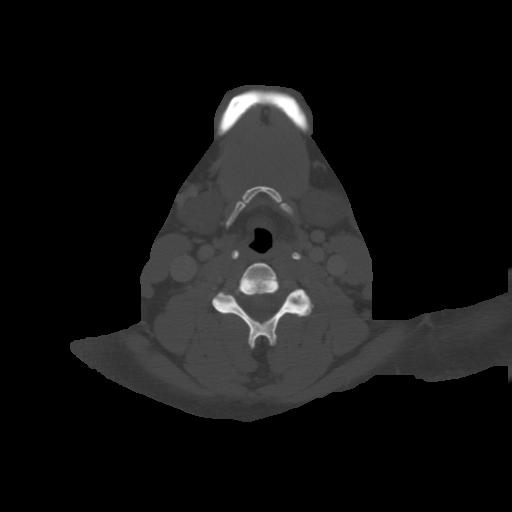
[im 101/127  bone]
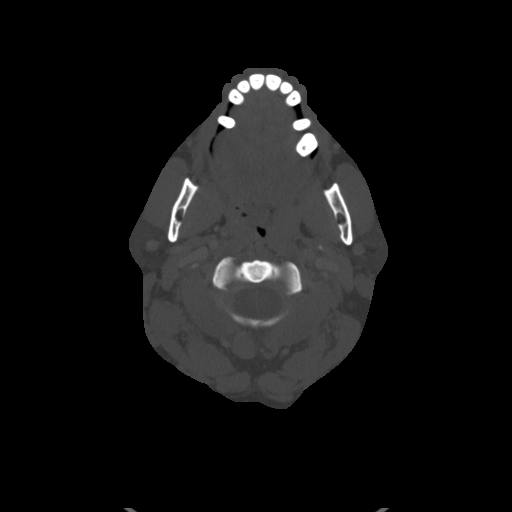

[Series 6: sag neck · sagittal · 0.46mm/px · 5 of 85 slices shown, 6 images]
[im 29/85  bone]
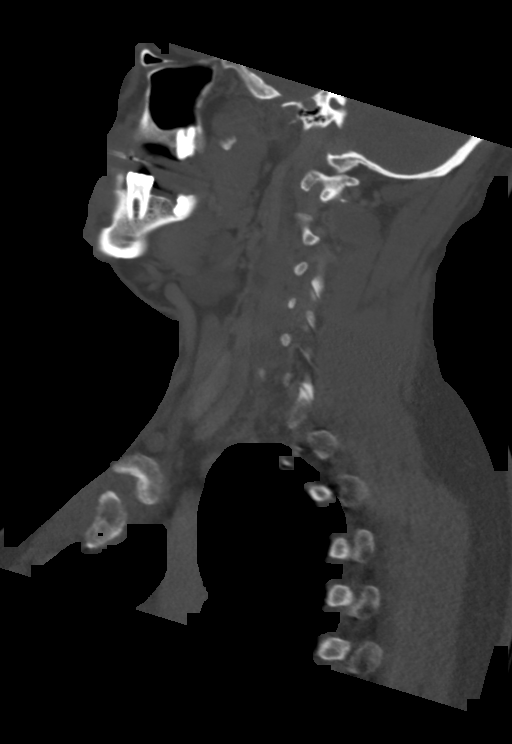
[im 36/85  bone]
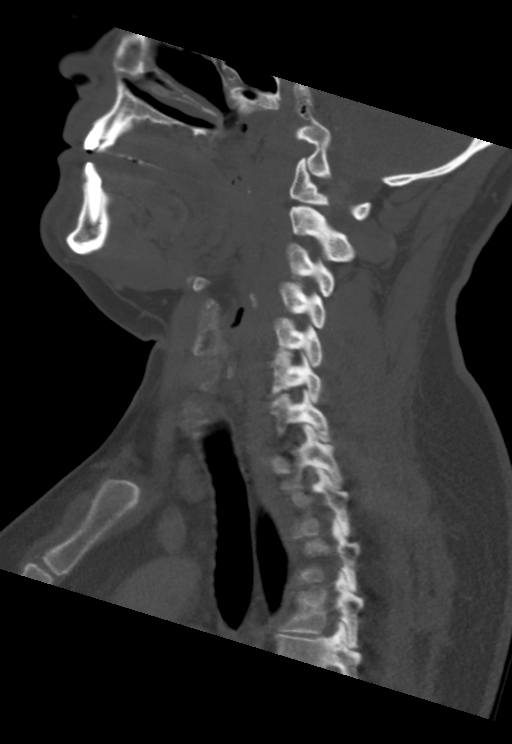
[im 43/85  soft-tissue]
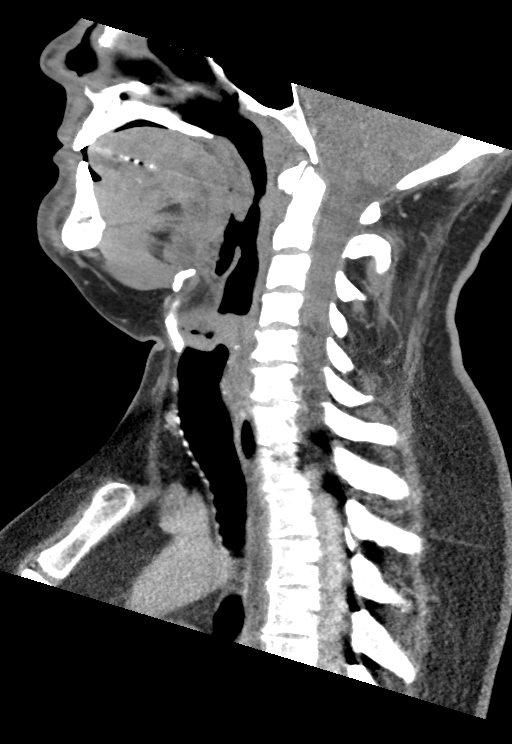
[im 43/85  bone]
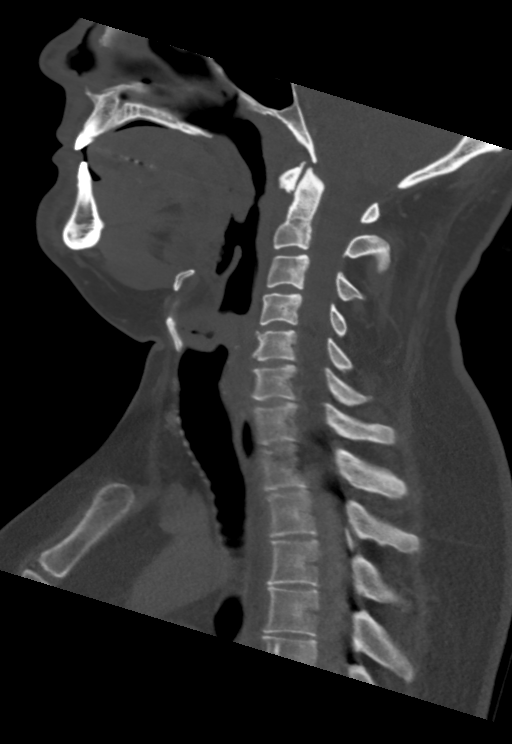
[im 50/85  bone]
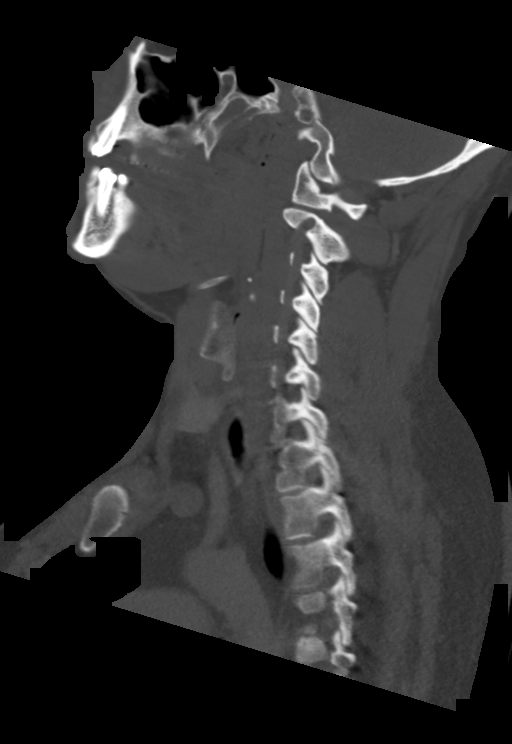
[im 57/85  bone]
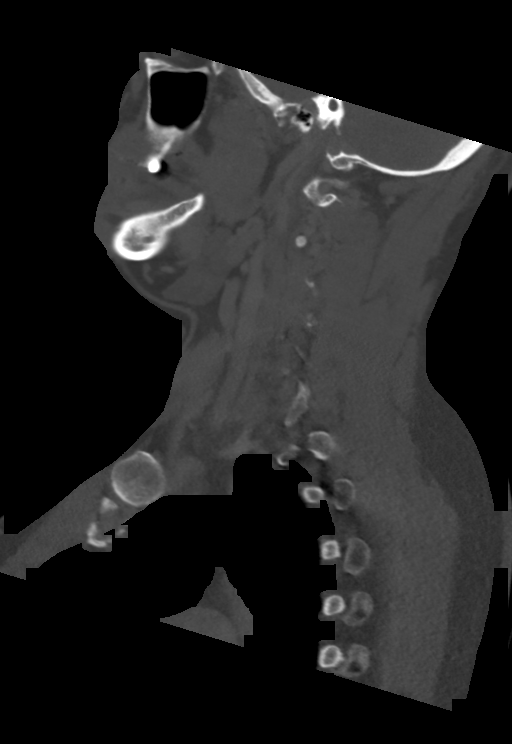

[Series 7: cor neck · coronal · 0.37mm/px · 3 of 120 slices shown]
[im 24/120  bone]
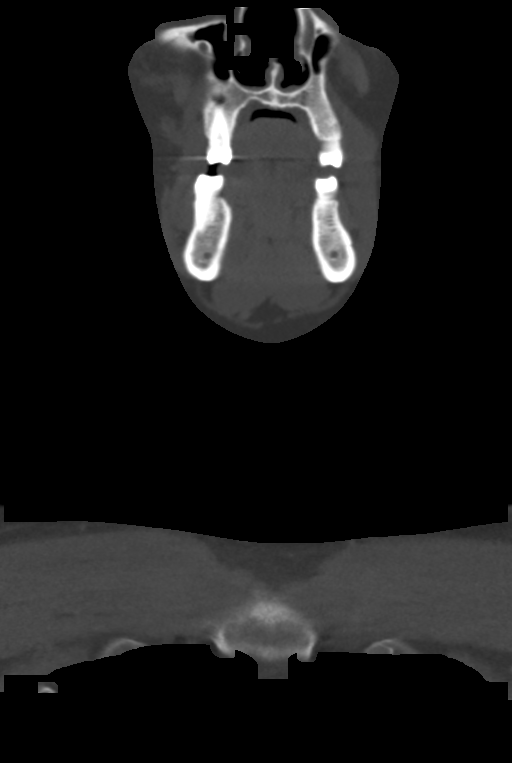
[im 48/120  bone]
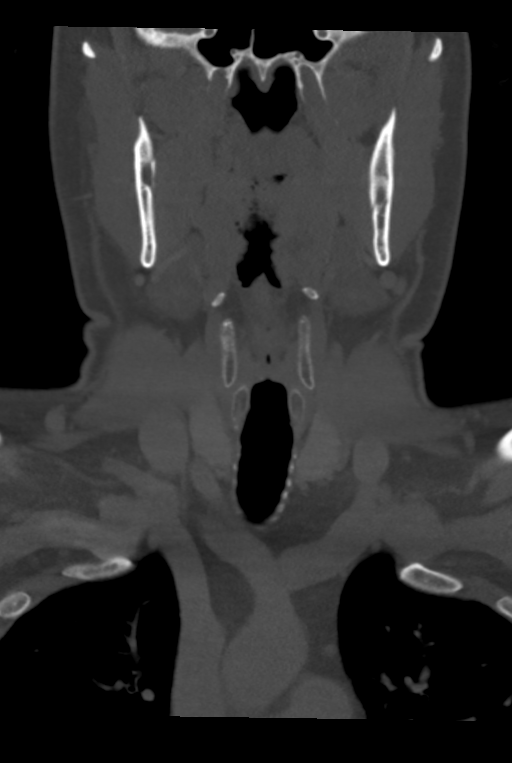
[im 72/120  bone]
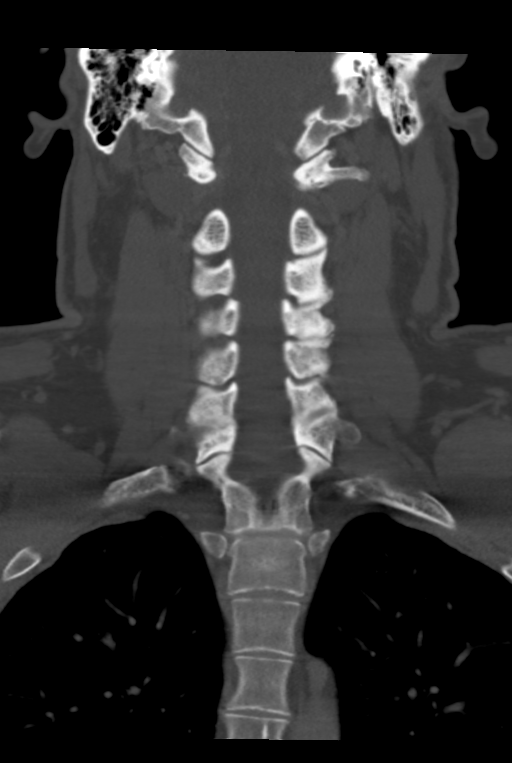

[Series 8: orthogonal ax · axial · 0.37mm/px · z∈[-327,-278]mm · 2 of 138 slices shown]
[im 28/138  bone]
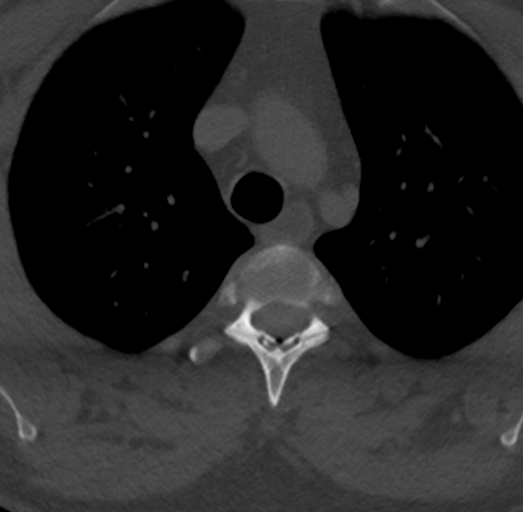
[im 55/138  bone]
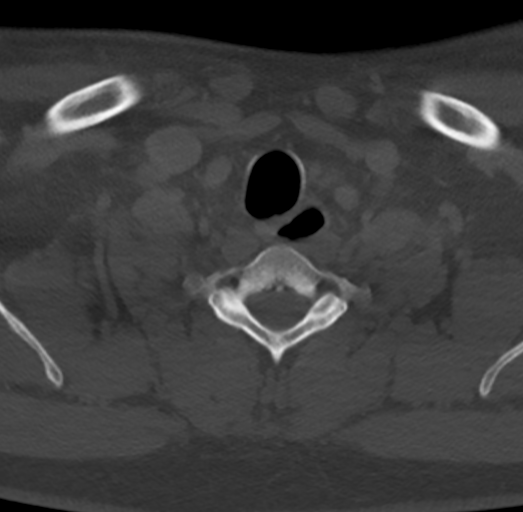

[14 of 33 positions shown; findings below may reference images not displayed]

FINDINGS: PHARYNX AND LARYNX: Normal.  Widely patent airway.

SALIVARY GLANDS: Normal.

THYROID: Normal.

LYMPH NODES: No lymphadenopathy by CT size criteria.

VASCULAR: Normal.

LIMITED INTRACRANIAL: Normal.

VISUALIZED ORBITS: Normal.

MASTOIDS AND VISUALIZED PARANASAL SINUSES: Well-aerated.

SKELETON: Nonacute. No abnormal intracanalicular enhancement. Mild
cervical spondylosis.

UPPER CHEST: Lung apices are clear. No superior mediastinal
lymphadenopathy.

OTHER: None.
IMPRESSION: No acute process in the neck.

## 2023-01-13 ENCOUNTER — Other Ambulatory Visit: Payer: Self-pay

## 2023-01-13 ENCOUNTER — Encounter: Payer: Self-pay | Admitting: Emergency Medicine

## 2023-01-13 ENCOUNTER — Emergency Department
Admission: EM | Admit: 2023-01-13 | Discharge: 2023-01-13 | Disposition: A | Discharge status: Home / Self Care | Payer: 59 | Attending: Emergency Medicine | Admitting: Emergency Medicine

## 2023-01-13 DIAGNOSIS — L03113 Cellulitis of right upper limb: Secondary | ICD-10-CM

## 2023-01-13 DIAGNOSIS — L03114 Cellulitis of left upper limb: Secondary | ICD-10-CM | POA: Insufficient documentation

## 2023-01-13 DIAGNOSIS — L02414 Cutaneous abscess of left upper limb: Secondary | ICD-10-CM | POA: Diagnosis not present

## 2023-01-13 DIAGNOSIS — L02519 Cutaneous abscess of unspecified hand: Secondary | ICD-10-CM

## 2023-01-13 LAB — COMPREHENSIVE METABOLIC PANEL
ALT: 52 U/L — ABNORMAL HIGH (ref 0–44)
AST: 55 U/L — ABNORMAL HIGH (ref 15–41)
Albumin: 4 g/dL (ref 3.5–5.0)
Alkaline Phosphatase: 85 U/L (ref 38–126)
Anion gap: 10 (ref 5–15)
BUN: 14 mg/dL (ref 6–20)
CO2: 25 mmol/L (ref 22–32)
Calcium: 8.9 mg/dL (ref 8.9–10.3)
Chloride: 102 mmol/L (ref 98–111)
Creatinine, Ser: 1.28 mg/dL — ABNORMAL HIGH (ref 0.61–1.24)
GFR, Estimated: >60 mL/min (ref >60)
Glucose, Bld: 120 mg/dL — ABNORMAL HIGH (ref 70–99)
Potassium: 4.7 mmol/L (ref 3.5–5.1)
Sodium: 137 mmol/L (ref 135–145)
Total Bilirubin: 1 mg/dL (ref 0.3–1.2)
Total Protein: 8.1 g/dL (ref 6.5–8.1)

## 2023-01-13 LAB — CBC WITH DIFFERENTIAL/PLATELET
Abs Immature Granulocytes: 0.01 10*3/uL (ref 0.00–0.07)
Basophils Absolute: 0 10*3/uL (ref 0.0–0.1)
Basophils Relative: 0 %
Eosinophils Absolute: 0.2 10*3/uL (ref 0.0–0.5)
Eosinophils Relative: 3 %
HCT: 41.6 % (ref 39.0–52.0)
Hemoglobin: 13.1 g/dL (ref 13.0–17.0)
Immature Granulocytes: 0 %
Lymphocytes Relative: 25 %
Lymphs Abs: 1.7 10*3/uL (ref 0.7–4.0)
MCH: 25.6 pg — ABNORMAL LOW (ref 26.0–34.0)
MCHC: 31.5 g/dL (ref 30.0–36.0)
MCV: 81.4 fL (ref 80.0–100.0)
Monocytes Absolute: 0.5 10*3/uL (ref 0.1–1.0)
Monocytes Relative: 7 %
Neutro Abs: 4.3 10*3/uL (ref 1.7–7.7)
Neutrophils Relative %: 65 %
Platelets: 230 10*3/uL (ref 150–400)
RBC: 5.11 MIL/uL (ref 4.22–5.81)
RDW: 13.5 % (ref 11.5–15.5)
WBC: 6.8 10*3/uL (ref 4.0–10.5)
nRBC: 0 % (ref 0.0–0.2)

## 2023-01-13 MED ORDER — DEXTROSE 5 % IV SOLN
1500.0000 mg | Freq: Once | INTRAVENOUS | Status: AC
  Administered 2023-01-13: 1500 mg via INTRAVENOUS
  Filled 2023-01-13: qty 75

## 2023-01-13 MED ORDER — LIDOCAINE HCL 1 % IJ SOLN
5.0000 mL | Freq: Once | INTRAMUSCULAR | Status: AC
  Administered 2023-01-13: 5 mL
  Filled 2023-01-13: qty 10

## 2023-01-13 NOTE — ED Triage Notes (Signed)
Pt in via POV, complaints of cellulitis to left hand, reports being IV drug user and missed.  Had Televisit on Friday, started antibiotics on Saturday, states swelling/redness has increased since.  Had second televisit today, adding Bactrim to regimen, states he chose to be seen here w/ hopes to have it drained due to amount of pressure he is having.  Ambulatory to triage, NAD noted at this time.

## 2023-01-13 NOTE — ED Notes (Signed)
RN unable to obtain blood cultures. Lab called requesting phlebotomist to draw blood cultures.

## 2023-01-13 NOTE — ED Notes (Signed)
Patient unable to tolerate rate of 500 ml/hr, rate turned down to level comfortable for patient. No signs or symptoms of infiltration noted.

## 2023-01-13 NOTE — ED Provider Notes (Signed)
[facility] Provider Note  Patient Contact: 6:46 PM (approximate)   History   Cellulitis   HPI  Robert Cobb is a 47 y.o. male with history of IV drug use, presents to the emergency department with and left hand cellulitis.  Patient denies fever or chills.  Patient has been admitted for hand cellulitis in the past.  Patient states that he does have reservations about being admitted because he states that he usually leaves AMA once he starts withdrawing.  He has been treated with Keflex and states that he had a televisit this morning and they added on Bactrim.  No nausea, vomiting or abdominal pain.      Physical Exam   Triage Vital Signs: ED Triage Vitals  Enc Vitals Group     BP 01/13/23 1747 103/64     Pulse Rate 01/13/23 1747 69     Resp 01/13/23 1747 18     Temp 01/13/23 1747 97.7 F (36.5 C)     Temp src --      SpO2 01/13/23 1747 95 %     Weight 01/13/23 1751 265 lb (120.2 kg)     Height 01/13/23 1751 5\' 10"  (1.778 m)     Head Circumference --      Peak Flow --      Pain Score 01/13/23 1750 7     Pain Loc --      Pain Edu? --      Excl. in [place]? --     Most recent vital signs: Vitals:   01/13/23 2053 01/13/23 2247  BP: 122/72 129/84  Pulse: 66 69  Resp: 18 20  Temp:  98.5 F (36.9 C)  SpO2: 99% 100%     General: Alert and in no acute distress. Eyes:  PERRL. EOMI. Head: No acute traumatic findings ENT:      Nose: No congestion/rhinnorhea.      Mouth/Throat: Mucous membranes are moist. Neck: No stridor. No cervical spine tenderness to palpation. Cardiovascular:  Good peripheral perfusion Respiratory: Normal respiratory effort without tachypnea or retractions. Lungs CTAB. Good air entry to the bases with no decreased or absent breath sounds. Gastrointestinal: Bowel sounds 4 quadrants. Soft and nontender to palpation. No guarding or rigidity. No palpable masses. No distention. No CVA tenderness. Musculoskeletal: Full range of  motion to all extremities.  Neurologic:  No gross focal neurologic deficits are appreciated.  Skin: Patient has edema and erythema along the dorsal aspect of the left hand.  Palpable radial and ulnar pulses bilaterally and symmetrically. Other:   ED Results / Procedures / Treatments   Labs (all labs ordered are listed, but only abnormal results are displayed) Labs Reviewed  COMPREHENSIVE METABOLIC PANEL - Abnormal; Notable for the following components:      Result Value   Glucose, Bld 120 (*)    Creatinine, Ser 1.28 (*)    AST 55 (*)    ALT 52 (*)    All other components within normal limits  CBC WITH DIFFERENTIAL/PLATELET - Abnormal; Notable for the following components:   MCH 25.6 (*)    All other components within normal limits  CULTURE, BLOOD (ROUTINE X 2)  CULTURE, BLOOD (ROUTINE X 2)          PROCEDURES:  Critical Care performed: No  ..Incision and Drainage  Date/Time: 01/13/2023 11:33 PM  Performed by: Orvil Feil, PA-C Authorized by: Orvil Feil, PA-C   Consent:    Consent obtained:  Verbal   Risks  discussed:  Bleeding and incomplete drainage Universal protocol:    Procedure explained and questions answered to patient or proxy's satisfaction: yes     Patient identity confirmed:  Verbally with patient Location:    Type:  Abscess   Location:  Upper extremity   Upper extremity location:  Hand   Hand location:  L hand Pre-procedure details:    Skin preparation:  Antiseptic wash Sedation:    Sedation type:  None Anesthesia:    Anesthesia method:  Local infiltration   Local anesthetic:  Lidocaine 1% w/o epi Procedure type:    Complexity:  Complex Procedure details:    Drainage:  Bloody   Packing materials:  None Post-procedure details:    Procedure completion:  Tolerated well, no immediate complications    MEDICATIONS ORDERED IN ED: Medications  lidocaine (XYLOCAINE) 1 % (with pres) injection 5 mL (5 mLs Infiltration Given by Other 01/13/23  1831)  dalbavancin (DALVANCE) 1,500 mg in dextrose 5 % 500 mL IVPB (0 mg Intravenous Stopped 01/13/23 2250)     IMPRESSION / MDM / ASSESSMENT AND PLAN / ED COURSE  I reviewed the triage vital signs and the nursing notes.                              Assessment and plan: Hand cellulitis:  47 year old male presents to the emergency department with moderate to severe left hand cellulitis.  Vital signs were reassuring at triage.  On exam, patient was alert, active and nontoxic-appearing.  Patient did have some induration along the posterior aspect of the left hand and swelling.  I recommended admission for patient for hand cellulitis.  Patient explained to me that he could not tolerate withdrawal from heroin and stated that he did not feel he could be admitted at this time.  He did ask that I attempt incision and drainage as he has had multiple abscesses in the past.  I did attempt incision and drainage but only yielded bloody exudate and no significant purulence.  I spoke to pharmacy regarding patient's presentation and they reviewed his labs and chart and recommended Dalbavancin as patient could receive injection and return in 1 week for additional IV antibiotic.  Pharmacy did request 2 sets of blood cultures but only 1 set could be obtained as it is difficult to establish IV access despite using IV team.  An ambulatory referral was placed to infectious disease per recommendation by pharmacy.  Patient understands recommendations given by pharmacy and clinical team during this emergency department encounter and anticipates returning in 1 week for an additional dose of dalbavancin.  I recommended returning to the emergency department sooner if he experiences worsening swelling, redness or fever.     FINAL CLINICAL IMPRESSION(S) / ED DIAGNOSES   Final diagnoses:  Cellulitis and abscess of hand     Rx / DC Orders   ED Discharge Orders          Ordered    Ambulatory referral to  Infectious Disease       Comments: Cellulitis patient:  Received dalbavancin on 01/13/2023.   01/13/23 1855             Note:  This document was prepared using Dragon voice recognition software and may include unintentional dictation errors.   Pia Mau Bath, PA-C 01/13/23 2339    Merwyn Katos, MD 01/15/23 [phone]

## 2023-01-13 NOTE — ED Notes (Addendum)
Phlebotomy state they were only able to obtain one set of blood cultures.

## 2023-01-13 NOTE — Discharge Instructions (Signed)
Return to the emergency department in 1 week for additional injection of antibiotic.

## 2023-01-15 LAB — CULTURE, BLOOD (ROUTINE X 2)

## 2023-01-18 LAB — CULTURE, BLOOD (ROUTINE X 2): Culture: NO GROWTH

## 2023-01-20 ENCOUNTER — Other Ambulatory Visit: Payer: Self-pay

## 2023-01-20 ENCOUNTER — Emergency Department
Admission: EM | Admit: 2023-01-20 | Discharge: 2023-01-20 | Disposition: A | Discharge status: Home / Self Care | Payer: 59 | Attending: Emergency Medicine | Admitting: Emergency Medicine

## 2023-01-20 DIAGNOSIS — Z5189 Encounter for other specified aftercare: Secondary | ICD-10-CM

## 2023-01-20 DIAGNOSIS — Z4800 Encounter for change or removal of nonsurgical wound dressing: Secondary | ICD-10-CM | POA: Diagnosis not present

## 2023-01-20 NOTE — ED Triage Notes (Signed)
Pt reports was here on 4/7 for a hand injury and infection, received IV abx and was told to come back in 1 week for a second round of antibiotics. Pt reports he feels like it is getting better and the pain is less.

## 2023-01-20 NOTE — ED Provider Notes (Signed)
[facility] Provider Note  Patient Contact: 5:04 PM (approximate)   History   Hand Problem   HPI  Robert Cobb is a 47 y.o. male with a history of IV drug use and hand cellulitis, presents to the emergency department to have a wound rechecked.  Patient was seen and evaluated for cellulitis on 01/13/2023 and was treated with dalbavancin.  Patient has had significant improvement in his symptoms.  Patient has had almost complete resolution of erythema and hand swelling.  He has been afebrile at home.  Patient does have a follow-up appointment with infectious disease.  He reached out to schedule appointment and also requested to be seen for hepatitis and he stated that he would need an additional referral.  Patient is also asking for an additional referral to infectious disease.      Physical Exam   Triage Vital Signs: ED Triage Vitals  Enc Vitals Group     BP 01/20/23 1645 133/84     Pulse Rate 01/20/23 1645 71     Resp 01/20/23 1645 18     Temp 01/20/23 1645 97.8 F (36.6 C)     Temp Source 01/20/23 1645 Oral     SpO2 01/20/23 1645 99 %     Weight 01/20/23 1643 260 lb (117.9 kg)     Height 01/20/23 1643  (1.778 m)     Head Circumference --      Peak Flow --      Pain Score 01/20/23 1643 2     Pain Loc --      Pain Edu? --      Excl. in GC? --     Most recent vital signs: Vitals:   01/20/23 1645  BP: 133/84  Pulse: 71  Resp: 18  Temp: 97.8 F (36.6 C)  SpO2: 99%     General: Alert and in no acute distress. Eyes:  PERRL. EOMI. Head: No acute traumatic findings ENT:      Nose: No congestion/rhinnorhea.      Mouth/Throat: Mucous membranes are moist. Neck: No stridor. No cervical spine tenderness to palpation. Cardiovascular:  Good peripheral perfusion Respiratory: Normal respiratory effort without tachypnea or retractions. Lungs CTAB. Good air entry to the bases with no decreased or absent breath sounds. Gastrointestinal: Bowel  sounds 4 quadrants. Soft and nontender to palpation. No guarding or rigidity. No palpable masses. No distention. No CVA tenderness. Musculoskeletal: Full range of motion to all extremities.  Neurologic:  No gross focal neurologic deficits are appreciated.  Skin: Hand erythema and edema significantly improved. Other:   ED Results / Procedures / Treatments   Labs (all labs ordered are listed, but only abnormal results are displayed) Labs Reviewed - No data to display     PROCEDURES:  Critical Care performed: No  Procedures   MEDICATIONS ORDERED IN ED: Medications - No data to display   IMPRESSION / MDM / ASSESSMENT AND PLAN / ED COURSE  I reviewed the triage vital signs and the nursing notes.                              Assessment and plan Wound recheck 47 year old male presents to the emergency department to have wound recheck and possible secondary dosing of dalbavancin.  Vital signs reassuring at triage.  On exam, patient alert, active and nontoxic-appearing.  Given overall improvement in symptoms, do not feel that an additional dose of dalbavancin is warranted  at this time.  Stressed the importance of wound care at home with patient and provided an additional referral to infectious disease per patient request.  Return precautions were given to return with new or worsening symptoms.  All patient questions were answered.     FINAL CLINICAL IMPRESSION(S) / ED DIAGNOSES   Final diagnoses:  Wound check, abscess     Rx / DC Orders   ED Discharge Orders     None        Note:  This document was prepared using Dragon voice recognition software and may include unintentional dictation errors.   Pia Mau [place], PA-C 01/20/23 1707    Jene Every, MD 01/20/23 [phone]

## 2023-01-22 ENCOUNTER — Ambulatory Visit: Payer: 59 | Admitting: Infectious Diseases

## 2023-01-24 ENCOUNTER — Ambulatory Visit: Payer: 59 | Admitting: Infectious Disease

## 2023-02-12 ENCOUNTER — Encounter: Payer: Self-pay | Admitting: Infectious Diseases

## 2023-02-12 ENCOUNTER — Ambulatory Visit: Payer: 59 | Attending: Infectious Disease | Admitting: Infectious Diseases

## 2023-02-12 VITALS — BP 113/75 | HR 61 | Temp 97.3°F | Ht 70.5 in | Wt 277.0 lb

## 2023-02-12 DIAGNOSIS — B182 Chronic viral hepatitis C: Secondary | ICD-10-CM | POA: Diagnosis present

## 2023-02-12 DIAGNOSIS — F17290 Nicotine dependence, other tobacco product, uncomplicated: Secondary | ICD-10-CM | POA: Insufficient documentation

## 2023-02-12 NOTE — Progress Notes (Signed)
NAME: Robert Cobb  DOB: Aug 24, 1976  MRN: [id number]  Date/Time: 02/12/2023 9:49 AM  Subjective:   ? Robert Cobb is a 47 y.o. with a history of IVDA, recurrent abscess of the hands, hepatitis C is here to engage in care for hepatitis C.  As per patient he was told that he had hepatitis C when he was in the prison many years ago. He never had seen any provider for that He recently was in the ED for cellulitis of the left hand and possible abscess and had received dalbavancin and this had cleared his cellulitis.  He is interested in getting treatment for hep C I do not have see any hep C labs in the system. He works at the [employer]. 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 He currently uses  clean needles and syringes Is very keen to get hep C treatment  Past Medical History:  Diagnosis Date   IVDU (intravenous drug user)     Past Surgical History:  Procedure Laterality Date   EYE SURGERY     Orbital socket surgery      Social History   Socioeconomic History   Marital status: Single    Spouse name: Not on file   Number of children: Not on file   Years of education: Not on file   Highest education level: Not on file  Occupational History   Not on file  Tobacco Use   Smoking status: Every Day    Types: E-cigarettes   Smokeless tobacco: Never  Vaping Use   Vaping Use: Every day  Substance and Sexual Activity   Alcohol use: No    Comment: rarely   Drug use: Yes    Types: IV    Comment: heroine, percocet, hydrocodone   Sexual activity: Not on file  Other Topics Concern   Not on file  Social History Narrative   Not on file   Social Determinants of Health   Financial Resource Strain: Not on file  Food Insecurity: Not on file  Transportation Needs: Not on file  Physical Activity: Not on file  Stress: Not on file  Social Connections: Not on file  Intimate Partner Violence: Not on file    No family history on file. No Known  Allergies I? No current outpatient medications on file.   No current facility-administered medications for this visit.     Abtx:  Anti-infectives (From admission, onward)    None       REVIEW OF SYSTEMS:  Const: negative fever, negative chills, negative weight loss Eyes: negative diplopia or visual changes, negative eye pain ENT: negative coryza, negative sore throat Resp: negative cough, hemoptysis, dyspnea Cards: negative for chest pain, palpitations, lower extremity edema GU: negative for frequency, dysuria and hematuria GI: Negative for abdominal pain, diarrhea, bleeding, constipation Skin: negative for rash and pruritus Heme: negative for easy bruising and gum/nose bleeding MS: negative for myalgias, arthralgias, back pain and muscle weakness Neurolo:negative for headaches, dizziness, vertigo, memory problems  Psych: negative for feelings of anxiety, depression  Endocrine: negative for thyroid, diabetes Allergy/Immunology- negative for any medication or food allergies  Objective:  VITALS:  BP 113/75   Pulse 61   Temp (!) 97.3 F (36.3 C) (Temporal)   Ht 5' 10.5" (1.791 m)   Wt 277 lb (125.6 kg)   BMI 39.18 kg/m   PHYSICAL EXAM:  General: Alert, cooperative, no distress, appears stated age.  Head: Normocephalic, without obvious abnormality, atraumatic. Eyes:  Conjunctivae clear, anicteric sclerae. Pupils are equal ENT Nares normal. No drainage or sinus tenderness. Lips, mucosa, and tongue normal. No Thrush Neck: Supple, symmetrical, no adenopathy, thyroid: non tender no carotid bruit and no JVD. Back: No CVA tenderness. Lungs: Clear to auscultation bilaterally. No Wheezing or Rhonchi. No rales. Heart: Regular rate and rhythm, no murmur, rub or gallop. Abdomen: Soft, non-tender,not distended. Bowel sounds normal. No masses Extremities: Left hand pudgy. No erythema or tenderness Skin: No rashes or lesions. Or bruising Lymph: Cervical, supraclavicular  normal. Neurologic: Grossly non-focal Pertinent Labs Lab Results CBC    Component Value Date/Time   WBC 6.8 01/13/2023 1805   RBC 5.11 01/13/2023 1805   HGB 13.1 01/13/2023 1805   HCT 41.6 01/13/2023 1805   PLT 230 01/13/2023 1805   MCV 81.4 01/13/2023 1805   MCH 25.6 (L) 01/13/2023 1805   MCHC 31.5 01/13/2023 1805   RDW 13.5 01/13/2023 1805   LYMPHSABS 1.7 01/13/2023 1805   MONOABS 0.5 01/13/2023 1805   EOSABS 0.2 01/13/2023 1805   BASOSABS 0.0 01/13/2023 1805       Latest Ref Rng & Units 01/13/2023    6:05 PM 05/24/2018   11:03 PM 04/18/2018   12:34 AM  CMP  Glucose 70 - 99 mg/dL 811  94  914   BUN 6 - 20 mg/dL 14  17  16    Creatinine 0.61 - 1.24 mg/dL 7.82  9.56  2.13   Sodium 135 - 145 mmol/L 137  138  142   Potassium 3.5 - 5.1 mmol/L 4.7  3.8  3.5   Chloride 98 - 111 mmol/L 102  102  107   CO2 22 - 32 mmol/L 25  26  24    Calcium 8.9 - 10.3 mg/dL 8.9  8.8  9.1   Total Protein 6.5 - 8.1 g/dL 8.1   8.1   Total Bilirubin 0.3 - 1.2 mg/dL 1.0   1.2   Alkaline Phos 38 - 126 U/L 85   62   AST 15 - 41 U/L 55   54   ALT 0 - 44 U/L 52   74     ? Impression/Recommendation History of hepatitis C. No labs in the system Today will check hep C, viral load and genotype.  Active IVDA but that is not a contraindication for treating for hep C.  He was made aware that he can get reinfected even after treatment if he continues to use IVDA. Patient is not sharing any needles. He is going to work every day. Will do labs today. And once the labs confirm active hepatitis C will get approval from his insurance for treatment.   antiviral of choice would be Epclusa which is a combination of sofosbuvir plus velpatasvir Discussed with him regarding quitting heroin Also discussed harm reduction strategies.  Follow-up after labs.  ___________________________________________________  Note:  This document was prepared using Conservation officer, historic buildings and may include unintentional  dictation errors.

## 2023-02-12 NOTE — Patient Instructions (Signed)
You are here for engaging in care for HEP C. Today will do labs and hen decide on treatment

## 2023-02-28 ENCOUNTER — Other Ambulatory Visit
Admission: RE | Admit: 2023-02-28 | Discharge: 2023-02-28 | Disposition: A | Discharge status: Home / Self Care | Payer: 59 | Attending: Infectious Diseases | Admitting: Infectious Diseases

## 2023-02-28 DIAGNOSIS — B182 Chronic viral hepatitis C: Secondary | ICD-10-CM | POA: Diagnosis present

## 2023-02-28 LAB — PROTIME-INR
INR: 1 (ref 0.8–1.2)
Prothrombin Time: 13.1 seconds (ref 11.4–15.2)

## 2023-02-28 LAB — HEPATITIS C ANTIBODY: HCV Ab: REACTIVE — AB

## 2023-02-28 LAB — HIV ANTIBODY (ROUTINE TESTING W REFLEX): HIV Screen 4th Generation wRfx: NONREACTIVE

## 2023-02-28 LAB — APTT: aPTT: 31 seconds (ref 24–36)

## 2023-03-02 LAB — HCV RNA QUANT
HCV Quantitative Log: 5.7 log10 IU/mL (ref >1.70)
HCV Quantitative: 501000 IU/mL (ref >50)

## 2023-03-02 LAB — AFP TUMOR MARKER: AFP, Serum, Tumor Marker: <1.8 ng/mL (ref 0.0–6.9)

## 2023-03-03 LAB — HEPATITIS C GENOTYPE

## 2023-03-07 LAB — HCV FIBROSURE
ALPHA 2-MACROGLOBULINS, QN: 134 mg/dL (ref 110–276)
ALT (SGPT) P5P: 69 IU/L — ABNORMAL HIGH (ref 0–55)
Apolipoprotein A-1: 137 mg/dL (ref 101–178)
Bilirubin, Total: <0.1 mg/dL (ref 0.0–1.2)
Fibrosis Score: 0.05 (ref 0.00–0.21)
GGT: 57 IU/L (ref 0–65)
Haptoglobin: 114 mg/dL (ref 23–355)
Necroinflammat Activity Score: 0.32 — ABNORMAL HIGH (ref 0.00–0.17)

## 2023-03-13 ENCOUNTER — Other Ambulatory Visit (HOSPITAL_COMMUNITY): Payer: Self-pay

## 2023-03-13 NOTE — Progress Notes (Signed)
Sure thing. Lupita Leash - can you please look into Epclusa x 12 weeks for him? Thanks!

## 2023-03-18 ENCOUNTER — Telehealth: Payer: Self-pay

## 2023-03-18 ENCOUNTER — Other Ambulatory Visit (HOSPITAL_COMMUNITY): Payer: Self-pay

## 2023-03-18 NOTE — Telephone Encounter (Signed)
[facility] Patient Advocate Encounter   Received notification from [facility] that prior authorization for Robert Cobb is required.   PA submitted on 03/18/2023 Key [id number] Status is pending    [facility] will continue to follow.   Clearance Coots, CPhT Specialty Pharmacy Patient [facility] Phone: [phone] Fax:  [phone]

## 2023-03-19 ENCOUNTER — Other Ambulatory Visit (HOSPITAL_COMMUNITY): Payer: Self-pay

## 2023-03-19 ENCOUNTER — Telehealth: Payer: Self-pay

## 2023-03-19 ENCOUNTER — Other Ambulatory Visit: Payer: Self-pay

## 2023-03-19 ENCOUNTER — Other Ambulatory Visit: Payer: Self-pay | Admitting: Pharmacist

## 2023-03-19 DIAGNOSIS — B182 Chronic viral hepatitis C: Secondary | ICD-10-CM

## 2023-03-19 MED ORDER — EPCLUSA 400-100 MG PO TABS
1.0000 | ORAL_TABLET | Freq: Every day | ORAL | 2 refills | Status: AC
Start: 2023-03-19 — End: ?

## 2023-03-19 NOTE — Telephone Encounter (Signed)
[facility] Patient Advocate Encounter   Was successful in obtaining a [place] copay card for [employer].  This copay card will make the patients copay $5.00.  I have spoken with the patient.    The billing information is as follows and has been shared with [facility].  Phone # [phone]       Clearance Coots, CPhT Specialty Pharmacy Patient [facility] Phone: [phone] Fax:  [phone]

## 2023-03-19 NOTE — Telephone Encounter (Signed)
[facility] Patient Advocate Encounter  Prior Authorization for Dorita Fray [employer]) has been approved.    PA# [id number] Effective dates: 03/18/23 through 06/10/23  Prescription will need to be filled with [facility].  [facility] will continue to follow.  Clearance Coots, CPhT Specialty Pharmacy Patient [facility] Phone: [phone] Fax:  [phone]

## 2023-03-19 NOTE — Telephone Encounter (Signed)
Script sent  

## 2023-03-20 ENCOUNTER — Telehealth: Payer: Self-pay

## 2023-03-20 NOTE — Telephone Encounter (Signed)
Patient called office stating that he received a call from [facility] stating that they need clarification on prescription before they can fill it. Updated pharmacy team who will call [facility].  Juanita Laster, RMA

## 2023-03-25 ENCOUNTER — Telehealth: Payer: Self-pay | Admitting: Pharmacist

## 2023-03-25 NOTE — Telephone Encounter (Signed)
Reached out to Jaiceion to see if he had any questions regarding his Epclusa. He started a few days ago and takes it between 6-9pm every evening. He has not missed any doses. Encouraged him not to miss any doses and explained how his chance of cure could go down with each dose missed. Counseled on what to do if dose is missed - if it is closer to the missed dose take immediately; if closer to next dose skip dose and take the next dose at the usual time. Counseled on common side effects such as headache, fatigue, and nausea and that these normally decrease with time. I reviewed his medications and found no drug interactions. Discussed that there are several drug interactions including acid suppressants. Instructed him to call Dr. Lynne Logan office if he wishes to start a new medication during course of therapy. Also advised to call if he experiences any side effects. He does not have a follow up scheduled with Dr. Lynne Logan office right now so I asked him to call and get one scheduled. Answered all questions.  Shawnn Bouillon L. Melinda Gwinner, PharmD, BCIDP, AAHIVP, CPP Clinical Pharmacist Practitioner Infectious Diseases Clinical Pharmacist [facility] 03/25/2023, 9:43 AM

## 2023-03-25 NOTE — Telephone Encounter (Signed)
Patient scheduled for 04/25/23. I received a message from Lupita Leash last week to schedule him.

## 2023-04-22 ENCOUNTER — Telehealth: Payer: Self-pay

## 2023-04-22 NOTE — Telephone Encounter (Signed)
Patient called, says he just took his last pill of Epclusa and has not received a refill yet from [facility]. He's called them and has not been able to get them to send a refill. Routing to pharmacy team for assistance.   Sandie Ano, RN

## 2023-04-23 ENCOUNTER — Telehealth: Payer: Self-pay

## 2023-04-23 NOTE — Telephone Encounter (Signed)
Thank you for coordinating Lupita Leash!

## 2023-04-23 NOTE — Telephone Encounter (Signed)
[facility] Patient Advocate Encounter  Patient came to [facility] and we gave him 1 bottle of Epclusa 400/100mg  , LOT # [id number] Exp 05/2023  Patient script was mailed to him from [facility] and was delivered on 07/03 , however patient said he never received his medication.   Clearance Coots, CPhT Specialty Pharmacy Patient [facility] for Infectious Disease Phone: [phone] Fax:  [phone]

## 2023-04-25 ENCOUNTER — Ambulatory Visit: Payer: 59 | Admitting: Infectious Diseases

## 2023-04-30 ENCOUNTER — Ambulatory Visit: Payer: 59 | Admitting: Infectious Diseases

## 2023-05-14 ENCOUNTER — Telehealth: Payer: Self-pay

## 2023-05-14 NOTE — Telephone Encounter (Signed)
Patient called office today stating he missed one dose of Epclusa (yesterday). States that he is concerned that this will effect his treatment. Would like to know if he should take a dose tonight as he usually does. Has already taken a dose earlier today.  Spoke with Cassie, Pharmacist who advised patient not take another dose tonight. Requested he resume regular schedule tomorrow.  Call transferred to front desk to reschedule missed appt. Juanita Laster, RMA

## 2023-05-16 ENCOUNTER — Ambulatory Visit: Payer: 59 | Attending: Infectious Diseases | Admitting: Infectious Diseases

## 2023-05-16 ENCOUNTER — Encounter: Payer: Self-pay | Admitting: Infectious Diseases

## 2023-05-16 ENCOUNTER — Other Ambulatory Visit
Admission: RE | Admit: 2023-05-16 | Discharge: 2023-05-16 | Disposition: A | Discharge status: Home / Self Care | Payer: 59 | Source: Ambulatory Visit | Attending: Infectious Diseases | Admitting: Infectious Diseases

## 2023-05-16 VITALS — BP 120/79 | HR 101 | Temp 97.2°F | Ht 70.0 in | Wt 266.0 lb

## 2023-05-16 DIAGNOSIS — B182 Chronic viral hepatitis C: Secondary | ICD-10-CM | POA: Diagnosis present

## 2023-05-16 DIAGNOSIS — F17290 Nicotine dependence, other tobacco product, uncomplicated: Secondary | ICD-10-CM | POA: Insufficient documentation

## 2023-05-16 DIAGNOSIS — B1920 Unspecified viral hepatitis C without hepatic coma: Secondary | ICD-10-CM | POA: Diagnosis present

## 2023-05-16 DIAGNOSIS — F1190 Opioid use, unspecified, uncomplicated: Secondary | ICD-10-CM | POA: Diagnosis not present

## 2023-05-16 LAB — COMPREHENSIVE METABOLIC PANEL
ALT: 19 U/L (ref 0–44)
AST: 25 U/L (ref 15–41)
Albumin: 3.6 g/dL (ref 3.5–5.0)
Alkaline Phosphatase: 52 U/L (ref 38–126)
Anion gap: 9 (ref 5–15)
BUN: 9 mg/dL (ref 6–20)
CO2: 25 mmol/L (ref 22–32)
Calcium: 8.8 mg/dL — ABNORMAL LOW (ref 8.9–10.3)
Chloride: 102 mmol/L (ref 98–111)
Creatinine, Ser: 1.01 mg/dL (ref 0.61–1.24)
GFR, Estimated: >60 mL/min (ref >60)
Glucose, Bld: 95 mg/dL (ref 70–99)
Potassium: 3.9 mmol/L (ref 3.5–5.1)
Sodium: 136 mmol/L (ref 135–145)
Total Bilirubin: 0.7 mg/dL (ref 0.3–1.2)
Total Protein: 7.6 g/dL (ref 6.5–8.1)

## 2023-05-16 LAB — HEPATITIS B CORE ANTIBODY, TOTAL: Hep B Core Total Ab: NONREACTIVE

## 2023-05-16 LAB — HEPATITIS A ANTIBODY, TOTAL: hep A Total Ab: NONREACTIVE

## 2023-05-16 LAB — HEPATITIS B SURFACE ANTIGEN: Hepatitis B Surface Ag: NONREACTIVE

## 2023-05-16 NOTE — Progress Notes (Signed)
NAME: Robert Cobb  DOB: 28-Aug-1976  MRN: [id number]  Date/Time: 05/16/2023 10:12 AM  Subjective:  Robert Cobb is a 47 y.o. with a history of IVDA, recurrent abscess of the hands, hepatitis C Hep C follow up visit Is on 2 nd month of Epclusa His 2nd month [rescritpion never reached him and he came to [facility] and they gave him a bottle of epclusa ?  As per patient he was told that he had hepatitis C when he was in the prison many years ago. He works at the [employer] place. 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 He currently uses  clean needles and syringes   Past Medical History:  Diagnosis Date   IVDU (intravenous drug user)     Past Surgical History:  Procedure Laterality Date   EYE SURGERY     Orbital socket surgery      Social History   Socioeconomic History   Marital status: Single    Spouse name: Not on file   Number of children: Not on file   Years of education: Not on file   Highest education level: Not on file  Occupational History   Not on file  Tobacco Use   Smoking status: Every Day    Types: E-cigarettes   Smokeless tobacco: Never  Vaping Use   Vaping status: Every Day  Substance and Sexual Activity   Alcohol use: No    Comment: rarely   Drug use: Yes    Types: IV    Comment: heroine, percocet, hydrocodone   Sexual activity: Not on file  Other Topics Concern   Not on file  Social History Narrative   Not on file   Social Determinants of Health   Financial Resource Strain: Not on file  Food Insecurity: Not on file  Transportation Needs: Not on file  Physical Activity: Not on file  Stress: Not on file  Social Connections: Not on file  Intimate Partner Violence: Not on file    No family history on file. No Known Allergies I? Current Outpatient Medications  Medication Sig Dispense Refill   EPCLUSA 400-100 MG TABS Take 1 tablet by mouth daily. 28 tablet 2   No current facility-administered medications for this visit.      Abtx:  Anti-infectives (From admission, onward)    None       REVIEW OF SYSTEMS:  Const: negative fever, negative chills, negative weight loss Eyes: negative diplopia or visual changes, negative eye pain ENT: negative coryza, negative sore throat Resp: negative cough, hemoptysis, dyspnea Cards: negative for chest pain, palpitations, lower extremity edema GU: negative for frequency, dysuria and hematuria GI: Negative for abdominal pain, diarrhea, bleeding, constipation Skin: negative for rash and pruritus Heme: negative for easy bruising and gum/nose bleeding MS: negative for myalgias, arthralgias, back pain and muscle weakness Neurolo:negative for headaches, dizziness, vertigo, memory problems  Psych: negative for feelings of anxiety, depression  Endocrine: negative for thyroid, diabetes Allergy/Immunology- negative for any medication or food allergies  Objective:  VITALS:  BP 120/79   Pulse (!) 101   Temp (!) 97.2 F (36.2 C) (Temporal)   Ht 5\' 10"  (1.778 m)   Wt 266 lb (120.7 kg)   BMI 38.17 kg/m   PHYSICAL EXAM:  General: Alert, cooperative, no distress, appears stated age.  Head: Normocephalic, without obvious abnormality, atraumatic. Eyes: Conjunctivae clear, anicteric sclerae. Pupils are equal ENT Nares normal. No drainage or sinus tenderness. Lips, mucosa, and tongue normal. No  Thrush Neck: Supple, symmetrical, no adenopathy, thyroid: non tender no carotid bruit and no JVD. Back: No CVA tenderness. Lungs: Clear to auscultation bilaterally. No Wheezing or Rhonchi. No rales. Heart: Regular rate and rhythm, no murmur, rub or gallop. Abdomen: Soft, non-tender,not distended. Bowel sounds normal. No masses Extremities: Left hand pudgy. No erythema or tenderness Skin: No rashes or lesions. Or bruising Lymph: Cervical, supraclavicular normal. Neurologic: Grossly non-focal Pertinent Labs None this visit  Tests result DATE comment  HEPC RNA 501,000    HEPC   Genotype 1a    Hepatitis B profile     Hepatitis A status     PT/PTT 13.1/31    AST, ALT, Bilirubin 55/52/1    Albumin 4    creatinine 1.28    HB/Platelet 13.1/230    HIV NR 02/28/23   AFP <1.8    TSH     comorbidities      tobacco     alcohol     drug     APRI Score     FIB 4 score     Current meds     Ultrasound Elastography     fibrosure No fibrosis -F0 inflammation -A1    Vaccination HEPA/HEPB      ? Impression/Recommendation HEPC on Epclusa X 6 weeks No cirrhosis Will check RNA/LFT   Active IVDA( not a contraindication for treating for hep C.  He was made aware that he can get reinfected even after treatment if he continues to use IVDA.) Patient is not sharing any needles. He is going to work every day.  HEPA/HEPB test today Follow up for vaccination if needed ___________________________________________________ Follow up 4 months Note:  This document was prepared using Dragon voice recognition software and may include unintentional dictation errors.

## 2023-05-24 ENCOUNTER — Telehealth: Payer: Self-pay

## 2023-05-24 NOTE — Telephone Encounter (Signed)
-----   Message from Lynn Ito sent at 05/20/2023 10:51 AM EDT ----- Please let him know that the Hepatitis C virus is undetectable. Thx ----- Message ----- From: Leory Plowman, [facility] [place] Sent: 05/16/2023  11:32 AM EDT To: Lynn Ito, MD

## 2023-05-24 NOTE — Telephone Encounter (Signed)
Several attempts to contact the patient. I have sent him a message via mychart. Neyla Gauntt T Pricilla Loveless

## 2023-09-17 ENCOUNTER — Ambulatory Visit: Payer: 59 | Admitting: Infectious Diseases

## 2023-12-07 DEATH — deceased
